# Patient Record
Sex: Female | Born: 1995 | Race: White | Hispanic: No | Marital: Single | Smoking: Never smoker
Health system: Southern US, Community
[De-identification: ages and names within clinical notes are randomized; demographics above are authoritative.]

## PROBLEM LIST (undated history)

## (undated) ENCOUNTER — Inpatient Hospital Stay (HOSPITAL_COMMUNITY): Payer: Self-pay

## (undated) ENCOUNTER — Emergency Department (HOSPITAL_COMMUNITY): Discharge status: Against Medical Advice | Payer: Medicaid Other

## (undated) DIAGNOSIS — A599 Trichomoniasis, unspecified: Secondary | ICD-10-CM

## (undated) DIAGNOSIS — B3731 Acute candidiasis of vulva and vagina: Secondary | ICD-10-CM

## (undated) DIAGNOSIS — N390 Urinary tract infection, site not specified: Secondary | ICD-10-CM

## (undated) DIAGNOSIS — T7840XA Allergy, unspecified, initial encounter: Secondary | ICD-10-CM

## (undated) DIAGNOSIS — A749 Chlamydial infection, unspecified: Secondary | ICD-10-CM

## (undated) DIAGNOSIS — B9689 Other specified bacterial agents as the cause of diseases classified elsewhere: Secondary | ICD-10-CM

## (undated) DIAGNOSIS — K219 Gastro-esophageal reflux disease without esophagitis: Secondary | ICD-10-CM

## (undated) DIAGNOSIS — N760 Acute vaginitis: Secondary | ICD-10-CM

## (undated) DIAGNOSIS — B373 Candidiasis of vulva and vagina: Secondary | ICD-10-CM

## (undated) HISTORY — DX: Gastro-esophageal reflux disease without esophagitis: K21.9

## (undated) HISTORY — PX: NO PAST SURGERIES: SHX2092

---
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
## 2016-05-23 ENCOUNTER — Encounter (HOSPITAL_COMMUNITY): Payer: Self-pay | Admitting: *Deleted

## 2016-05-23 ENCOUNTER — Emergency Department (HOSPITAL_COMMUNITY)
Admission: EM | Admit: 2016-05-23 | Discharge: 2016-05-23 | Disposition: A | Discharge status: Home / Self Care | Payer: No Typology Code available for payment source | Attending: Emergency Medicine | Admitting: Emergency Medicine

## 2016-05-23 DIAGNOSIS — Z79899 Other long term (current) drug therapy: Secondary | ICD-10-CM | POA: Diagnosis not present

## 2016-05-23 DIAGNOSIS — S060X0A Concussion without loss of consciousness, initial encounter: Secondary | ICD-10-CM | POA: Insufficient documentation

## 2016-05-23 DIAGNOSIS — Y939 Activity, unspecified: Secondary | ICD-10-CM | POA: Insufficient documentation

## 2016-05-23 DIAGNOSIS — Y999 Unspecified external cause status: Secondary | ICD-10-CM | POA: Diagnosis not present

## 2016-05-23 DIAGNOSIS — S0990XA Unspecified injury of head, initial encounter: Secondary | ICD-10-CM | POA: Diagnosis present

## 2016-05-23 DIAGNOSIS — Y92410 Unspecified street and highway as the place of occurrence of the external cause: Secondary | ICD-10-CM | POA: Insufficient documentation

## 2016-05-23 MED ORDER — METOCLOPRAMIDE HCL 10 MG PO TABS
10.0000 mg | ORAL_TABLET | Freq: Once | ORAL | Status: AC
  Administered 2016-05-23: 10 mg via ORAL
  Filled 2016-05-23: qty 1

## 2016-05-23 MED ORDER — IBUPROFEN 400 MG PO TABS
600.0000 mg | ORAL_TABLET | Freq: Once | ORAL | Status: AC
  Administered 2016-05-23: 400 mg via ORAL
  Filled 2016-05-23: qty 1

## 2016-05-23 MED ORDER — METOCLOPRAMIDE HCL 10 MG PO TABS
10.0000 mg | ORAL_TABLET | Freq: Three times a day (TID) | ORAL | 0 refills | Status: DC | PRN
Start: 2016-05-23 — End: 2016-12-24
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
## 2016-09-02 ENCOUNTER — Encounter: Payer: Self-pay | Admitting: Emergency Medicine

## 2016-09-02 ENCOUNTER — Emergency Department (HOSPITAL_COMMUNITY): Payer: Medicaid Other

## 2016-09-02 ENCOUNTER — Emergency Department (HOSPITAL_COMMUNITY)
Admission: EM | Admit: 2016-09-02 | Discharge: 2016-09-03 | Disposition: A | Discharge status: Home / Self Care | Payer: Medicaid Other | Attending: Emergency Medicine | Admitting: Emergency Medicine

## 2016-09-02 DIAGNOSIS — N898 Other specified noninflammatory disorders of vagina: Secondary | ICD-10-CM | POA: Diagnosis not present

## 2016-09-02 DIAGNOSIS — O26899 Other specified pregnancy related conditions, unspecified trimester: Secondary | ICD-10-CM

## 2016-09-02 DIAGNOSIS — R1030 Lower abdominal pain, unspecified: Secondary | ICD-10-CM | POA: Diagnosis present

## 2016-09-02 DIAGNOSIS — Z3A15 15 weeks gestation of pregnancy: Secondary | ICD-10-CM | POA: Diagnosis not present

## 2016-09-02 DIAGNOSIS — R109 Unspecified abdominal pain: Secondary | ICD-10-CM

## 2016-09-02 LAB — WET PREP, GENITAL
Sperm: NONE SEEN
Trich, Wet Prep: NONE SEEN
Yeast Wet Prep HPF POC: NONE SEEN

## 2016-09-02 LAB — I-STAT BETA HCG BLOOD, ED (MC, WL, AP ONLY)

## 2016-09-02 LAB — URINALYSIS, ROUTINE W REFLEX MICROSCOPIC
BILIRUBIN URINE: NEGATIVE
Glucose, UA: NEGATIVE mg/dL
Hgb urine dipstick: NEGATIVE
KETONES UR: 5 mg/dL — AB
Nitrite: NEGATIVE
PH: 6 (ref 5.0–8.0)
Protein, ur: 30 mg/dL — AB
Specific Gravity, Urine: 1.03 (ref 1.005–1.030)

## 2016-09-02 LAB — CBC
HCT: 34.7 % — ABNORMAL LOW (ref 36.0–46.0)
HEMOGLOBIN: 11.8 g/dL — AB (ref 12.0–15.0)
MCH: 29.4 pg (ref 26.0–34.0)
MCHC: 34 g/dL (ref 30.0–36.0)
MCV: 86.5 fL (ref 78.0–100.0)
Platelets: 259 10*3/uL (ref 150–400)
RBC: 4.01 MIL/uL (ref 3.87–5.11)
RDW: 12.5 % (ref 11.5–15.5)
WBC: 7.1 10*3/uL (ref 4.0–10.5)

## 2016-09-02 LAB — COMPREHENSIVE METABOLIC PANEL
ALT: 8 U/L — AB (ref 14–54)
ANION GAP: 6 (ref 5–15)
AST: 17 U/L (ref 15–41)
Albumin: 3.2 g/dL — ABNORMAL LOW (ref 3.5–5.0)
Alkaline Phosphatase: 54 U/L (ref 38–126)
BUN: 8 mg/dL (ref 6–20)
CHLORIDE: 106 mmol/L (ref 101–111)
CO2: 23 mmol/L (ref 22–32)
Calcium: 8.7 mg/dL — ABNORMAL LOW (ref 8.9–10.3)
Creatinine, Ser: 0.56 mg/dL (ref 0.44–1.00)
GFR calc non Af Amer: >60 mL/min (ref >60)
Glucose, Bld: 85 mg/dL (ref 65–99)
Potassium: 3.7 mmol/L (ref 3.5–5.1)
SODIUM: 135 mmol/L (ref 135–145)
Total Bilirubin: 0.3 mg/dL (ref 0.3–1.2)
Total Protein: 6.1 g/dL — ABNORMAL LOW (ref 6.5–8.1)

## 2016-09-02 LAB — LIPASE, BLOOD: Lipase: 26 U/L (ref 11–51)

## 2016-09-02 MED ORDER — PRENATAL COMPLETE 14-0.4 MG PO TABS: 1.0000 | ORAL_TABLET | Freq: Every day | ORAL | 0 refills | Status: DC

## 2016-09-02 NOTE — ED Notes (Signed)
Patient transported to Ultrasound 

## 2016-09-02 NOTE — Discharge Instructions (Signed)
make an appointment to establish obstetric care. Take prenatal vitamins as prescribed

## 2016-09-02 NOTE — ED Triage Notes (Signed)
Reports having lower abdominal cramping that goes into back.  On and off for the last two days.  States my last real period was in February.  Had several positive tests but unsure of how far along.  No bleeding or urinary symptoms.

## 2016-09-02 NOTE — ED Provider Notes (Signed)
[facility]-EMERGENCY DEPT Provider Note   CSN: [id number] Arrival date & time: 09/02/16  2035     History   Chief Complaint Chief Complaint  Patient presents with  . Abdominal Pain    HPI Gail Byrd is a 21 y.o. female.  This normally healthy 21 year old female who comes to the emergency room tonight with 2 days of low back pain, vaginal pain and lower abdominal pain.  She states she is having small amount of yellow/white vaginal discharge.  Denies any dysuria.  States her last normal menstrual period was February 2018.  She does not use any form of birth control.      Past Medical History:  Diagnosis Date  . Allergy   . Bacterial vaginosis   . Urinary tract infection   . Vaginal yeast infection     Patient Active Problem List   Diagnosis Date Noted  . Normal pregnancy, first 11/12/2014  . SVD (spontaneous vaginal delivery) 11/12/2014  . Abdominal pain affecting pregnancy, antepartum   . PCB (post coital bleeding)   . Second trimester bleeding   . [redacted] weeks gestation of pregnancy     Past Surgical History:  Procedure Laterality Date  . NO PAST SURGERIES      OB History    Gravida Para Term Preterm AB Living   1 1 1     1    SAB TAB Ectopic Multiple Live Births         0 1       Home Medications    Prior to Admission medications   Medication Sig Start Date End Date Taking? Authorizing Provider  Prenatal Vit-Fe Fumarate-FA (PRENATAL MULTIVITAMIN) TABS tablet Take 1 tablet by mouth daily at 12 noon.   Yes [provider]  albuterol (PROVENTIL HFA;VENTOLIN HFA) 108 (90 BASE) MCG/ACT inhaler Inhale 2 puffs into the lungs every 6 (six) hours as needed for wheezing or shortness of breath (emergency asthma medication).     [provider]  ibuprofen (ADVIL,MOTRIN) 600 MG tablet Take 1 tablet (600 mg total) by mouth every 6 (six) hours. Patient not taking: Reported on 09/02/2016 11/14/14   Meisinger, Tawanna Cooler, MD  metoCLOPramide (REGLAN) 10 MG tablet  Take 1 tablet (10 mg total) by mouth 3 (three) times daily as needed for nausea (headache). Patient not taking: Reported on 09/02/2016 05/23/16   Tomasita Crumble, MD  oxyCODONE-acetaminophen (PERCOCET/ROXICET) 5-325 MG per tablet Take 1-2 tablets by mouth every 4 (four) hours as needed for severe pain. Patient not taking: Reported on 09/02/2016 11/14/14   Meisinger, Tawanna Cooler, MD  Prenatal Vit-Fe Fumarate-FA (PRENATAL COMPLETE) 14-0.4 MG TABS Take 1 tablet by mouth daily. 09/02/16   Earley Favor, NP  Prenatal Vit-Fe Fumarate-FA (PRENATAL COMPLETE) 14-0.4 MG TABS Take 1 tablet by mouth daily. 09/02/16   Earley Favor, NP    Family History Family History  Problem Relation Age of Onset  . Diabetes Paternal Grandmother   . Depression Paternal Grandmother   . Drug abuse Paternal Grandmother   . Early death Paternal Grandmother   . Heart disease Paternal Grandmother   . Hypertension Paternal Grandmother   . Birth defects Mother   . Depression Mother   . Learning disabilities Mother   . Depression Father   . Learning disabilities Father   . Learning disabilities Sister   . Learning disabilities Brother   . Alcohol abuse Maternal Grandmother   . Depression Maternal Grandmother   . Mental illness Maternal Grandmother   . Arthritis Neg Hx   .  Asthma Neg Hx   . Cancer Neg Hx   . COPD Neg Hx   . Hearing loss Neg Hx   . Hyperlipidemia Neg Hx   . Kidney disease Neg Hx   . Mental retardation Neg Hx   . Miscarriages / Stillbirths Neg Hx   . Stroke Neg Hx   . Vision loss Neg Hx   . Varicose Veins Neg Hx     Social History Social History  Substance Use Topics  . Smoking status: Never Smoker  . Smokeless tobacco: Never Used  . Alcohol use No     Allergies   Patient has no known allergies.   Review of Systems Review of Systems  Constitutional: Negative for fever.  Respiratory: Negative for shortness of breath.   Cardiovascular: Negative for chest pain.  Gastrointestinal: Positive for abdominal  pain.  Genitourinary: Positive for vaginal discharge and vaginal pain. Negative for dysuria and vaginal bleeding.  Musculoskeletal: Positive for back pain.  All other systems reviewed and are negative.    Physical Exam Updated Vital Signs BP 103/72 (BP Location: Right Arm)   Pulse 81   Temp 98.6 F (37 C) (Oral)   Resp 18   Ht 5\' 3"  (1.6 m)   Wt 53.1 kg (117 lb 2 oz)   LMP 05/30/2016 (Approximate)   SpO2 100%   BMI 20.75 kg/m   Physical Exam  Constitutional: She appears well-developed and well-nourished.  HENT:  Head: Normocephalic.  Eyes: Pupils are equal, round, and reactive to light.  Neck: Normal range of motion.  Cardiovascular: Regular rhythm.  Tachycardia present.   Pulmonary/Chest: Effort normal.  Abdominal: Soft. She exhibits distension. There is no tenderness.  Genitourinary: Uterus is enlarged. Vaginal discharge found.  Musculoskeletal: Normal range of motion.  Neurological: She is alert.  Skin: Skin is warm.  Psychiatric: She has a normal mood and affect.  Nursing note and vitals reviewed.    ED Treatments / Results  Labs (all labs ordered are listed, but only abnormal results are displayed) Labs Reviewed  WET PREP, GENITAL - Abnormal; Notable for the following:       Result Value   Clue Cells Wet Prep HPF POC PRESENT (*)    WBC, Wet Prep HPF POC MANY (*)    All other components within normal limits  COMPREHENSIVE METABOLIC PANEL - Abnormal; Notable for the following:    Calcium 8.7 (*)    Total Protein 6.1 (*)    Albumin 3.2 (*)    ALT 8 (*)    All other components within normal limits  CBC - Abnormal; Notable for the following:    Hemoglobin 11.8 (*)    HCT 34.7 (*)    All other components within normal limits  URINALYSIS, ROUTINE W REFLEX MICROSCOPIC - Abnormal; Notable for the following:    Color, Urine AMBER (*)    APPearance CLOUDY (*)    Ketones, ur 5 (*)    Protein, ur 30 (*)    Leukocytes, UA LARGE (*)    Bacteria, UA RARE (*)     Squamous Epithelial / LPF 6-30 (*)    All other components within normal limits  I-STAT BETA HCG BLOOD, ED ([facility], [facility], [facility] ONLY) - Abnormal; Notable for the following:    I-stat hCG, quantitative >2,000.0 (*)    All other components within normal limits  LIPASE, BLOOD  GC/CHLAMYDIA PROBE AMP ([place]) NOT AT [facility]    EKG  EKG Interpretation None  Radiology [place] Ob Limited  Result Date: 09/02/2016 CLINICAL DATA:  Pregnant patient in second trimester pregnancy with lower abdominal pain for 2 days. EXAM: LIMITED OBSTETRIC ULTRASOUND FINDINGS: Number of Fetuses: 1 Heart Rate:  150 bpm Movement: Yes Presentation: Cephalic Placental Location: Anterior Previa: No Amniotic Fluid (Subjective):  Within normal limits. BPD:  3.01cm 15w  4d MATERNAL FINDINGS: Cervix:  Appears closed. Uterus/Adnexae: No abnormality visualized. IMPRESSION: Single live intrauterine pregnancy estimated gestational age [redacted] weeks 4 days. Estimated date of delivery 02/20/2017. This exam is performed on an emergent basis and does not comprehensively evaluate fetal size, dating, or anatomy; follow-up complete OB US should be considered if further fetal assessment is warranted. Electronically Signed   By: Rubye OaksMelanie  Ehinger M.D.   On: 09/02/2016 23:41    Procedures Procedures (including critical care time)  Medications Ordered in ED Medications - No data to display   Initial Impression / Assessment and Plan / ED Course  I have reviewed the triage vital signs and the nursing notes.  Pertinent labs & imaging results that were available during my care of the patient were reviewed by me and considered in my medical decision making (see chart for details).     Patient has single live 15 week 4 day fetus.  She was discharged home with prescription for prenatal vitamins and OB follow-up  Final Clinical Impressions(s) / ED Diagnoses   Final diagnoses:  [redacted] weeks gestation of pregnancy    New Prescriptions New  Prescriptions   PRENATAL VIT-FE FUMARATE-FA (PRENATAL COMPLETE) 14-0.4 MG TABS    Take 1 tablet by mouth daily.   PRENATAL VIT-FE FUMARATE-FA (PRENATAL COMPLETE) 14-0.4 MG TABS    Take 1 tablet by mouth daily.     Earley FavorSchulz, Tekeya Geffert, NP 09/02/16 2359    Gerhard MunchLockwood, Robert, MD 09/03/16 [phone]

## 2016-09-02 NOTE — ED Notes (Signed)
Pt returned from ultrasound

## 2016-09-03 LAB — GC/CHLAMYDIA PROBE AMP (~~LOC~~) NOT AT ARMC
Chlamydia: POSITIVE — AB
Neisseria Gonorrhea: NEGATIVE

## 2016-09-04 ENCOUNTER — Emergency Department (HOSPITAL_COMMUNITY)
Admission: EM | Admit: 2016-09-04 | Discharge: 2016-09-04 | Disposition: A | Discharge status: Home / Self Care | Payer: Medicaid Other | Attending: Emergency Medicine | Admitting: Emergency Medicine

## 2016-09-04 ENCOUNTER — Encounter (HOSPITAL_COMMUNITY): Payer: Self-pay

## 2016-09-04 DIAGNOSIS — Z79899 Other long term (current) drug therapy: Secondary | ICD-10-CM | POA: Diagnosis not present

## 2016-09-04 DIAGNOSIS — A64 Unspecified sexually transmitted disease: Secondary | ICD-10-CM | POA: Diagnosis not present

## 2016-09-04 DIAGNOSIS — Z3A15 15 weeks gestation of pregnancy: Secondary | ICD-10-CM | POA: Insufficient documentation

## 2016-09-04 DIAGNOSIS — O98312 Other infections with a predominantly sexual mode of transmission complicating pregnancy, second trimester: Secondary | ICD-10-CM | POA: Insufficient documentation

## 2016-09-04 MED ORDER — AZITHROMYCIN 250 MG PO TABS
1000.0000 mg | ORAL_TABLET | Freq: Once | ORAL | Status: AC
  Administered 2016-09-04: 1000 mg via ORAL
  Filled 2016-09-04: qty 4

## 2016-09-04 MED ORDER — AZITHROMYCIN 250 MG PO TABS: 1000.0000 mg | ORAL_TABLET | Freq: Once | ORAL | Status: DC

## 2016-09-04 MED ORDER — AZITHROMYCIN 1 G PO PACK
1.0000 g | PACK | Freq: Once | ORAL | Status: AC
  Administered 2016-09-04: 1 g via ORAL
  Filled 2016-09-04: qty 1

## 2016-09-04 NOTE — ED Triage Notes (Addendum)
Pt state she was contacted today, after previous visit and told she required further treatment due to positive  Std test. States [redacted] weeks pregnant.

## 2016-09-04 NOTE — Discharge Instructions (Addendum)
Please read attached information. If you experience any new or worsening signs or symptoms please return to the emergency room for evaluation. Please follow-up with your primary care provider or specialist as discussed. Please use medication prescribed only as directed and discontinue taking if you have any concerning signs or symptoms.   °

## 2016-09-04 NOTE — ED Provider Notes (Signed)
[facility]-EMER[facility]Y DEPT Provider Note   CSN: [id number] Arrival date & time: 09/04/16  1616   By signing my name below, I, Soijett Blue, attest that this documentation has been prepared under the direction and in the presence of Burna Forts, PA-C Electronically Signed: Soijett Blue, ED Scribe. 09/04/16. 6:55 PM.  History   Chief Complaint Chief Complaint  Patient presents with  . Abnormal Lab    HPI Gail Byrd is a 21 y.o. female who presents to the Emergency Department complaining of abnormal lab results onset 2 days ago. Pt reports associated vaginal discharge, lower abdominal pain, and nausea. Pt has not tried any medications for the relief of her symptoms. Pt states that she was evaluated in the ED on 09/02/2016 for abdominal pain and vaginal discharge. She notes that she found out that she was approximately [redacted] weeks pregnant and reports that this is her second pregnancy. She notes that she was contacted today and informed that she tested positive for a STD and would need treatment. Pt reports that she is [redacted] weeks pregnant. Denies having an OB-GYN specialist at this time. She denies vaginal bleeding, vomiting, and any other symptoms.     The history is provided by the patient. No language interpreter was used.    Past Medical History:  Diagnosis Date  . Allergy   . Bacterial vaginosis   . Urinary tract infection   . Vaginal yeast infection     Patient Active Problem List   Diagnosis Date Noted  . Normal pregnancy, first 11/12/2014  . SVD (spontaneous vaginal delivery) 11/12/2014  . Abdominal pain affecting pregnancy, antepartum   . PCB (post coital bleeding)   . Second trimester bleeding   . [redacted] weeks gestation of pregnancy     Past Surgical History:  Procedure Laterality Date  . NO PAST SURGERIES      OB History    Gravida Para Term Preterm AB Living   3 1 1     1    SAB TAB Ectopic Multiple Live Births         0 1       Home Medications    Prior to  Admission medications   Medication Sig Start Date End Date Taking? Authorizing Provider  albuterol (PROVENTIL HFA;VENTOLIN HFA) 108 (90 BASE) MCG/ACT inhaler Inhale 2 puffs into the lungs every 6 (six) hours as needed for wheezing or shortness of breath (emergency asthma medication).     [provider]  ibuprofen (ADVIL,MOTRIN) 600 MG tablet Take 1 tablet (600 mg total) by mouth every 6 (six) hours. Patient not taking: Reported on 09/02/2016 11/14/14   Meisinger, Tawanna Cooler, MD  metoCLOPramide (REGLAN) 10 MG tablet Take 1 tablet (10 mg total) by mouth 3 (three) times daily as needed for nausea (headache). Patient not taking: Reported on 09/02/2016 05/23/16   Tomasita Crumble, MD  oxyCODONE-acetaminophen (PERCOCET/ROXICET) 5-325 MG per tablet Take 1-2 tablets by mouth every 4 (four) hours as needed for severe pain. Patient not taking: Reported on 09/02/2016 11/14/14   Meisinger, Tawanna Cooler, MD  Prenatal Vit-Fe Fumarate-FA (PRENATAL COMPLETE) 14-0.4 MG TABS Take 1 tablet by mouth daily. 09/02/16   Earley Favor, NP  Prenatal Vit-Fe Fumarate-FA (PRENATAL COMPLETE) 14-0.4 MG TABS Take 1 tablet by mouth daily. 09/02/16   Earley Favor, NP  Prenatal Vit-Fe Fumarate-FA (PRENATAL MULTIVITAMIN) TABS tablet Take 1 tablet by mouth daily at 12 noon.    [provider]    Family History Family History  Problem Relation Age of Onset  .  Diabetes Paternal Grandmother   . Depression Paternal Grandmother   . Drug abuse Paternal Grandmother   . Early death Paternal Grandmother   . Heart disease Paternal Grandmother   . Hypertension Paternal Grandmother   . Birth defects Mother   . Depression Mother   . Learning disabilities Mother   . Depression Father   . Learning disabilities Father   . Learning disabilities Sister   . Learning disabilities Brother   . Alcohol abuse Maternal Grandmother   . Depression Maternal Grandmother   . Mental illness Maternal Grandmother   . Arthritis Neg Hx   . Asthma Neg Hx   . Cancer  Neg Hx   . COPD Neg Hx   . Hearing loss Neg Hx   . Hyperlipidemia Neg Hx   . Kidney disease Neg Hx   . Mental retardation Neg Hx   . Miscarriages / Stillbirths Neg Hx   . Stroke Neg Hx   . Vision loss Neg Hx   . Varicose Veins Neg Hx     Social History Social History  Substance Use Topics  . Smoking status: Never Smoker  . Smokeless tobacco: Never Used  . Alcohol use No     Allergies   Patient has no known allergies.   Review of Systems Review of Systems  Gastrointestinal: Positive for abdominal pain and nausea. Negative for vomiting.  Genitourinary: Positive for vaginal discharge. Negative for vaginal bleeding.  All other systems reviewed and are negative.    Physical Exam Updated Vital Signs BP 109/71 (BP Location: Right Arm)   Pulse 95   Temp 98.9 F (37.2 C) (Oral)   Resp 16   Ht 5\' 3"  (1.6 m)   Wt 117 lb (53.1 kg)   LMP 05/30/2016 (Approximate)   SpO2 99%   BMI 20.73 kg/m   Physical Exam  Constitutional: She is oriented to person, place, and time. She appears well-developed and well-nourished. No distress.  HENT:  Head: Normocephalic and atraumatic.  Eyes: EOM are normal.  Neck: Neck supple.  Cardiovascular: Normal rate.   Pulmonary/Chest: Effort normal. No respiratory distress.  Abdominal: Normal appearance. She exhibits no distension. There is no tenderness.  Gravid abdomen.   Genitourinary: Cervix exhibits no motion tenderness. Right adnexum displays no tenderness. Left adnexum displays no tenderness. Vaginal discharge found.  Genitourinary Comments: Nurse chaperone present for exam. Purulent vaginal discharge. No CMT. No adnexal tenderness.  Musculoskeletal: Normal range of motion.  Neurological: She is alert and oriented to person, place, and time.  Skin: Skin is warm and dry.  Psychiatric: She has a normal mood and affect. Her behavior is normal.  Nursing note and vitals reviewed.    ED Treatments / Results  DIAGNOSTIC STUDIES: Oxygen  Saturation is 99% on RA, nl by my interpretation.    COORDINATION OF CARE: 6:49 PM Discussed treatment plan with pt at bedside which includes pelvic exam and pt agreed to plan.   Labs (all labs ordered are listed, but only abnormal results are displayed) Labs Reviewed - No data to display  EKG  EKG Interpretation None       Radiology US Ob Limited  Result Date: 09/02/2016 CLINICAL DATA:  Pregnant patient in second trimester pregnancy with lower abdominal pain for 2 days. EXAM: LIMITED OBSTETRIC ULTRASOUND FINDINGS: Number of Fetuses: 1 Heart Rate:  150 bpm Movement: Yes Presentation: Cephalic Placental Location: Anterior Previa: No Amniotic Fluid (Subjective):  Within normal limits. BPD:  3.01cm 15w  4d MATERNAL FINDINGS: Cervix:  Appears  closed. Uterus/Adnexae: No abnormality visualized. IMPRESSION: Single live intrauterine pregnancy estimated gestational age [redacted] weeks 4 days. Estimated date of delivery 02/20/2017. This exam is performed on an emergent basis and does not comprehensively evaluate fetal size, dating, or anatomy; follow-up complete OB US should be considered if further fetal assessment is warranted. Electronically Signed   By: Rubye OaksMelanie  Ehinger M.D.   On: 09/02/2016 23:41    Procedures Procedures (including critical care time)  Medications Ordered in ED Medications  azithromycin (ZITHROMAX) powder 1 g (not administered)  azithromycin (ZITHROMAX) tablet 1,000 mg (1,000 mg Oral Given 09/04/16 2025)     Initial Impression / Assessment and Plan / ED Course  I have reviewed the triage vital signs and the nursing notes.  Pertinent labs & imaging results that were available during my care of the patient were reviewed by me and considered in my medical decision making (see chart for details).     Labs:   Imaging:  Consults:  Therapeutics:Azithromycin  Discharge Meds:   Assessment/Plan: 21 year old female presents today with follow-up from laboratory analysis.  Patient is [redacted] weeks pregnant, she has a positive chlamydia, negative gonorrhea test. Pelvic exam shows no significant cervical motion tenderness, no signs of systemic illness. I have very low suspicion for PID in this patient. She'll be treated with one dose of azithromycin. She is encouraged follow-up with her OB/GYN. Return precautions given. She verbalized understanding and agreement to today's plan.  Patient vomited up medicine 15 minutes after taking it. Although I did not see any digested pills in her vomit which she had any This is concerning that she did not receive the appropriate dose. I spoke with pharmacy they agreed that re-dosing at 1 g would be the most appropriate treatment plan in this patient as likely she did not receive the medicine. Patient will be even one more gram in a power phone and discharged home.  Final Clinical Impressions(s) / ED Diagnoses   Final diagnoses:  STD (female)    New Prescriptions Discharge Medication List as of 09/04/2016  8:12 PM     I personally performed the services described in this documentation, which was scribed in my presence. The recorded information has been reviewed and is accurate.    Eyvonne MechanicHedges, Jahnasia Tatum, PA-C 09/04/16 2050    Eyvonne MechanicHedges, Parris Signer, PA-C 09/04/16 2125    Benjiman CorePickering, Nathan, MD 09/04/16 2240

## 2016-10-04 ENCOUNTER — Encounter (HOSPITAL_COMMUNITY): Payer: Self-pay | Admitting: *Deleted

## 2016-10-04 ENCOUNTER — Inpatient Hospital Stay (HOSPITAL_COMMUNITY)
Admission: AD | Admit: 2016-10-04 | Discharge: 2016-10-04 | Disposition: A | Discharge status: Home / Self Care | Payer: Medicaid Other | Source: Ambulatory Visit | Attending: Obstetrics & Gynecology | Admitting: Obstetrics & Gynecology

## 2016-10-04 DIAGNOSIS — R1032 Left lower quadrant pain: Secondary | ICD-10-CM | POA: Diagnosis present

## 2016-10-04 DIAGNOSIS — O9989 Other specified diseases and conditions complicating pregnancy, childbirth and the puerperium: Secondary | ICD-10-CM

## 2016-10-04 DIAGNOSIS — Z3A19 19 weeks gestation of pregnancy: Secondary | ICD-10-CM | POA: Diagnosis not present

## 2016-10-04 DIAGNOSIS — N898 Other specified noninflammatory disorders of vagina: Secondary | ICD-10-CM | POA: Diagnosis not present

## 2016-10-04 DIAGNOSIS — R1030 Lower abdominal pain, unspecified: Secondary | ICD-10-CM

## 2016-10-04 DIAGNOSIS — O26892 Other specified pregnancy related conditions, second trimester: Secondary | ICD-10-CM | POA: Insufficient documentation

## 2016-10-04 DIAGNOSIS — N949 Unspecified condition associated with female genital organs and menstrual cycle: Secondary | ICD-10-CM

## 2016-10-04 LAB — URINALYSIS, ROUTINE W REFLEX MICROSCOPIC
Bilirubin Urine: NEGATIVE
GLUCOSE, UA: NEGATIVE mg/dL
HGB URINE DIPSTICK: NEGATIVE
Ketones, ur: NEGATIVE mg/dL
NITRITE: NEGATIVE
PH: 6 (ref 5.0–8.0)
Protein, ur: 30 mg/dL — AB
Specific Gravity, Urine: 1.028 (ref 1.005–1.030)

## 2016-10-04 LAB — WET PREP, GENITAL
CLUE CELLS WET PREP: NONE SEEN
Sperm: NONE SEEN
Trich, Wet Prep: NONE SEEN
YEAST WET PREP: NONE SEEN

## 2016-10-04 LAB — GC/CHLAMYDIA PROBE AMP (~~LOC~~) NOT AT ARMC
Chlamydia: NEGATIVE
Neisseria Gonorrhea: NEGATIVE

## 2016-10-04 NOTE — MAU Provider Note (Signed)
History     CSN: [id number]  Arrival date and time: 10/04/16 0208   First Provider Initiated Contact with Patient 10/04/16 0253      Chief Complaint  Patient presents with  . Flank Pain  . Other    leg ache   HPI Gail Byrd is a 21 y.o. G2P1001 at 206w2d who presents to MAU today with complaint of LLQ abdominal pain. She states that it started at 2200 last night. She states it is rated at a 5/10 now, but was worse earlier. She states pain is mostly with walking and standing. She denies vaginal bleeding, fever, N/V/D or constipation. She has had a yellow discharge. She was treated for Chlamydia last month.    OB History    Gravida Para Term Preterm AB Living   2 1 1     1    SAB TAB Ectopic Multiple Live Births         0 1      Past Medical History:  Diagnosis Date  . Allergy   . Bacterial vaginosis   . Urinary tract infection   . Vaginal yeast infection     Past Surgical History:  Procedure Laterality Date  . NO PAST SURGERIES      Family History  Problem Relation Age of Onset  . Diabetes Paternal Grandmother   . Depression Paternal Grandmother   . Drug abuse Paternal Grandmother   . Early death Paternal Grandmother   . Heart disease Paternal Grandmother   . Hypertension Paternal Grandmother   . Birth defects Mother   . Depression Mother   . Learning disabilities Mother   . Depression Father   . Learning disabilities Father   . Learning disabilities Sister   . Learning disabilities Brother   . Alcohol abuse Maternal Grandmother   . Depression Maternal Grandmother   . Mental illness Maternal Grandmother   . Arthritis Neg Hx   . Asthma Neg Hx   . Cancer Neg Hx   . COPD Neg Hx   . Hearing loss Neg Hx   . Hyperlipidemia Neg Hx   . Kidney disease Neg Hx   . Mental retardation Neg Hx   . Miscarriages / Stillbirths Neg Hx   . Stroke Neg Hx   . Vision loss Neg Hx   . Varicose Veins Neg Hx     Social History  Substance Use Topics  . Smoking  status: Never Smoker  . Smokeless tobacco: Never Used  . Alcohol use No    Allergies: No Known Allergies  Prescriptions Prior to Admission  Medication Sig Dispense Refill Last Dose  . albuterol (PROVENTIL HFA;VENTOLIN HFA) 108 (90 BASE) MCG/ACT inhaler Inhale 2 puffs into the lungs every 6 (six) hours as needed for wheezing or shortness of breath (emergency asthma medication).    rescue  . ibuprofen (ADVIL,MOTRIN) 600 MG tablet Take 1 tablet (600 mg total) by mouth every 6 (six) hours. (Patient not taking: Reported on 09/02/2016) 30 tablet 0 Completed Course at Unknown time  . metoCLOPramide (REGLAN) 10 MG tablet Take 1 tablet (10 mg total) by mouth 3 (three) times daily as needed for nausea (headache). (Patient not taking: Reported on 09/02/2016) 10 tablet 0 Completed Course at Unknown time  . oxyCODONE-acetaminophen (PERCOCET/ROXICET) 5-325 MG per tablet Take 1-2 tablets by mouth every 4 (four) hours as needed for severe pain. (Patient not taking: Reported on 09/02/2016) 20 tablet 0 Completed Course at Unknown time  . Prenatal Vit-Fe Fumarate-FA (PRENATAL COMPLETE)  14-0.4 MG TABS Take 1 tablet by mouth daily. 60 each 0   . Prenatal Vit-Fe Fumarate-FA (PRENATAL COMPLETE) 14-0.4 MG TABS Take 1 tablet by mouth daily. 60 each 0   . Prenatal Vit-Fe Fumarate-FA (PRENATAL MULTIVITAMIN) TABS tablet Take 1 tablet by mouth daily at 12 noon.   09/01/2016 at Unknown time    Review of Systems  Constitutional: Negative for fever.  Gastrointestinal: Positive for abdominal pain. Negative for constipation, diarrhea, nausea and vomiting.  Genitourinary: Positive for vaginal discharge. Negative for dysuria, frequency, urgency and vaginal bleeding.   Physical Exam   Blood pressure 115/66, pulse 87, temperature 99.5 F (37.5 C), resp. rate 16, last menstrual period 05/30/2016, unknown if currently breastfeeding.  Physical Exam  Nursing note and vitals reviewed. Constitutional: She is oriented to person, place,  and time. She appears well-developed and well-nourished. No distress.  HENT:  Head: Normocephalic and atraumatic.  Cardiovascular: Normal rate.   Respiratory: Effort normal.  GI: Soft. She exhibits no distension and no mass. There is no tenderness. There is no rebound and no guarding.  Genitourinary: Uterus is enlarged. Uterus is not tender. Cervix exhibits friability. Cervix exhibits no motion tenderness and no discharge. No bleeding in the vagina. Vaginal discharge (small yellow, mucous) found.  Neurological: She is alert and oriented to person, place, and time.  Skin: Skin is warm and dry. No erythema.  Psychiatric: She has a normal mood and affect.  Dilation: Closed Effacement (%): Thick Cervical Position: Posterior Exam by:: Vonzella Nipple, PA-C   Results for orders placed or performed during the hospital encounter of 10/04/16 (from the past 24 hour(s))  Urinalysis, Routine w reflex microscopic     Status: Abnormal   Collection Time: 10/04/16  2:35 AM  Result Value Ref Range   Color, Urine YELLOW YELLOW   APPearance HAZY (A) CLEAR   Specific Gravity, Urine 1.028 1.005 - 1.030   pH 6.0 5.0 - 8.0   Glucose, UA NEGATIVE NEGATIVE mg/dL   Hgb urine dipstick NEGATIVE NEGATIVE   Bilirubin Urine NEGATIVE NEGATIVE   Ketones, ur NEGATIVE NEGATIVE mg/dL   Protein, ur 30 (A) NEGATIVE mg/dL   Nitrite NEGATIVE NEGATIVE   Leukocytes, UA SMALL (A) NEGATIVE   RBC / HPF 0-5 0 - 5 RBC/hpf   WBC, UA 6-30 0 - 5 WBC/hpf   Bacteria, UA RARE (A) NONE SEEN   Squamous Epithelial / LPF 6-30 (A) NONE SEEN   Mucous PRESENT   Wet prep, genital     Status: Abnormal   Collection Time: 10/04/16  3:10 AM  Result Value Ref Range   Yeast Wet Prep HPF POC NONE SEEN NONE SEEN   Trich, Wet Prep NONE SEEN NONE SEEN   Clue Cells Wet Prep HPF POC NONE SEEN NONE SEEN   WBC, Wet Prep HPF POC TOO NUMEROUS TO COUNT (A) NONE SEEN   Sperm NONE SEEN     MAU Course  Procedures None  MDM FHR - 140 bpm with  doppler UA, wet prep and GC/Chlmaydia today  Patient offered presumptive treatment for Chlamydia given recent infection and TNTC WBCs on wet prep. Patient declines stating that she has not had sexual contact since treatment last month.  Assessment and Plan  A: SIUP at [redacted]w[redacted]d Round ligament pain Vaginal discharge in pregnancy, second trimester   P: Discharge home Tylenol PRN for pain Discussed use of abdominal binder and warm bath/shower for pain  Second trimester precautions discussed Patient advised to follow-up with [facility] as  scheduled to start prenatal care Patient may return to MAU as needed or if her condition were to change or worsen   Vonzella Nipple, PA-C 10/04/2016, 3:37 AM

## 2016-10-04 NOTE — Discharge Instructions (Signed)

## 2016-10-04 NOTE — MAU Note (Signed)
Pt states she started having some sharp pain on the left side of her side.  It comes and goes which started around 2200 and around 0000 her legs started aching.  Denies LOF/VB.

## 2016-10-05 LAB — CULTURE, OB URINE: Culture: NO GROWTH

## 2016-10-09 LAB — OB RESULTS CONSOLE GC/CHLAMYDIA: GC PROBE AMP, GENITAL: NEGATIVE

## 2016-10-09 LAB — OB RESULTS CONSOLE HIV ANTIBODY (ROUTINE TESTING): HIV: NONREACTIVE

## 2016-10-09 LAB — OB RESULTS CONSOLE RUBELLA ANTIBODY, IGM: Rubella: IMMUNE

## 2016-10-09 LAB — OB RESULTS CONSOLE HEPATITIS B SURFACE ANTIGEN: HEP B S AG: NEGATIVE

## 2016-10-12 ENCOUNTER — Inpatient Hospital Stay (HOSPITAL_COMMUNITY)
Admission: AD | Admit: 2016-10-12 | Discharge: 2016-10-12 | Disposition: A | Discharge status: Home / Self Care | Payer: Medicaid Other | Source: Ambulatory Visit | Attending: Obstetrics and Gynecology | Admitting: Obstetrics and Gynecology

## 2016-10-12 ENCOUNTER — Encounter (HOSPITAL_COMMUNITY): Payer: Self-pay | Admitting: *Deleted

## 2016-10-12 DIAGNOSIS — O368120 Decreased fetal movements, second trimester, not applicable or unspecified: Secondary | ICD-10-CM | POA: Diagnosis not present

## 2016-10-12 DIAGNOSIS — Z3A20 20 weeks gestation of pregnancy: Secondary | ICD-10-CM | POA: Diagnosis not present

## 2016-10-12 DIAGNOSIS — Z3492 Encounter for supervision of normal pregnancy, unspecified, second trimester: Secondary | ICD-10-CM

## 2016-10-12 LAB — URINALYSIS, ROUTINE W REFLEX MICROSCOPIC
Bilirubin Urine: NEGATIVE
GLUCOSE, UA: NEGATIVE mg/dL
Hgb urine dipstick: NEGATIVE
Ketones, ur: NEGATIVE mg/dL
Nitrite: NEGATIVE
PH: 6 (ref 5.0–8.0)
Protein, ur: NEGATIVE mg/dL
SPECIFIC GRAVITY, URINE: 1.011 (ref 1.005–1.030)

## 2016-10-12 NOTE — Discharge Instructions (Signed)
Constipation, Adult Constipation is when a person has fewer bowel movements in a week than normal, has difficulty having a bowel movement, or has stools that are dry, hard, or larger than normal. Constipation may be caused by an underlying condition. It may become worse with age if a person takes certain medicines and does not take in enough fluids. Follow these instructions at home: Eating and drinking   Eat foods that have a lot of fiber, such as fresh fruits and vegetables, whole grains, and beans.  Limit foods that are high in fat, low in fiber, or overly processed, such as french fries, hamburgers, cookies, candies, and soda.  Drink enough fluid to keep your urine clear or pale yellow. General instructions  Exercise regularly or as told by your health care provider.  Go to the restroom when you have the urge to go. Do not hold it in.  Take over-the-counter and prescription medicines only as told by your health care provider. These include any fiber supplements.  Practice pelvic floor retraining exercises, such as deep breathing while relaxing the lower abdomen and pelvic floor relaxation during bowel movements.  Watch your condition for any changes.  Keep all follow-up visits as told by your health care provider. This is important. Contact a health care provider if:  You have pain that gets worse.  You have a fever.  You do not have a bowel movement after 4 days.  You vomit.  You are not hungry.  You lose weight.  You are bleeding from the anus.  You have thin, pencil-like stools. Get help right away if:  You have a fever and your symptoms suddenly get worse.  You leak stool or have blood in your stool.  Your abdomen is bloated.  You have severe pain in your abdomen.  You feel dizzy or you faint. This information is not intended to replace advice given to you by your health care provider. Make sure you discuss any questions you have with your health care  provider. Document Released: 12/16/2003 Document Revised: 10/07/2015 Document Reviewed: 09/07/2015 Elsevier Interactive Patient Education  2017 [employer] Preventing Injuries During Pregnancy Trauma is the most common cause of injury and death in pregnant women. This can also result in serious harm to the baby or even death. How can injuries affect my pregnancy? Your baby is protected in the womb (uterus) by a sac filled with fluid (amniotic sac). Your baby can be harmed if there is a direct blow to your abdomen and pelvis. Trauma may be caused by:  Falls. These are more common in the second and third trimester of pregnancy.  Automobile accidents.  Domestic violence or assault.  Severe burns, such as from fire or electricity.  These injuries can result in:  Tearing of your uterus.  The placenta pulling away from the wall of the uterus (placental abruption).  The amniotic sac breaking open (rupture of membranes).  Blockage or decrease in the blood supply to your baby.  Going into labor earlier than expected.  Severe injuries to other parts of your body, such as your brain, spine, heart, lungs, or other organs.  Minor falls and low-impact automobile accidents do not usually harm your baby, even if they cause a little harm to you. What can I do to lower my risk? Safety  Remove slippery rugs and loose objects on the floor. They increase your risk of tripping or slipping.  Wear comfortable shoes that have a good grip on the sole. Do not  wear high-heeled shoes.  Always wear your seat belt properly when riding in a car. Use both the lap and shoulder belt, with the lap belt below your abdomen. Always practice safe driving. Do not ride on a motorcycle while pregnant. Activity  Avoid walking on wet or slippery floors.  Do not participate in rough and violent activities or sports.  Avoid high-risk situations and activities such as: ? Lifting heavy pots of boiling or hot  liquids. ? Fixing electrical problems. ? Being near fires or starting fires. General instructions  Take over-the-counter and prescription medicines only as told by your health care provider.  Know your blood type and the father's blood type in case you develop vaginal bleeding or experience an injury for which a blood transfusion is needed.  Spousal abuse can be a serious cause of trauma during pregnancy. If you are a victim of domestic violence or assault: ? Call your local emergency services (911 in the [place] ? Contact the [employer] Violence Hotline for help and support. When should I seek immediate medical care? Get help right away if:  You fall on your abdomen or experience any serious blow to your abdomen.  You develop stiffness in your neck or pain after a fall or from other trauma.  You develop a headache or vision problems after a fall or from other trauma.  You do not feel the baby moving after a fall or trauma, or you feel that the baby is not moving as much as before the fall or trauma.  You have been the victim of domestic violence or any other kind of physical attack.  You have been in a car accident.  You develop vaginal bleeding.  You have fluid leaking from the vagina.  You develop uterine contractions. Symptoms include pelvic cramping, pain, or serious low back pain.  You become weak, faint, or have uncontrolled vomiting after trauma.  You have a serious burn. This includes burns to the face, neck, hands, or genitals, or burns greater than the size of your palm anywhere else.  Summary  Trauma is the most common cause of injury and death in pregnant women and can also lead to injury or death of the baby.  Falls, automobile accidents, domestic violence or assault, and severe burns can injure you or your baby. Make sure to get medical help right away if you experience any of these during your pregnancy.  Take steps to prevent slips or falls in your  home, such as avoiding slippery floors and removing loose rugs.  Always wear your seat belt properly when riding in a car. Practice safe driving. This information is not intended to replace advice given to you by your health care provider. Make sure you discuss any questions you have with your health care provider. Document Released: 04/26/2004 Document Revised: 03/28/2016 Document Reviewed: 03/28/2016 Elsevier Interactive Patient Education  2017 [employer].

## 2016-10-12 NOTE — MAU Provider Note (Signed)
History     CSN: [id number]  Arrival date and time: 10/12/16 2028  First Provider Initiated Contact with Patient 10/12/16 2127      Chief Complaint  Patient presents with  . Decreased Fetal Movement   HPI Gail Byrd is a 21 y.o. G2P1001 at [redacted]w[redacted]d who presents with decreased fetal movement. States yesterday (Thursday) at noon she hit her abdomen with the handle bar of her daughter's stroller. Since then reported decrease in fetal movement. States she normally feels baby move several times per day. States movement has improved since this afternoon. Denies abdominal pain, vaginal bleeding, or LOF.   OB History    Gravida Para Term Preterm AB Living   2 1 1     1    SAB TAB Ectopic Multiple Live Births         0 1      Past Medical History:  Diagnosis Date  . Allergy   . Bacterial vaginosis   . Urinary tract infection   . Vaginal yeast infection     Past Surgical History:  Procedure Laterality Date  . NO PAST SURGERIES      Family History  Problem Relation Age of Onset  . Diabetes Paternal Grandmother   . Depression Paternal Grandmother   . Drug abuse Paternal Grandmother   . Early death Paternal Grandmother   . Heart disease Paternal Grandmother   . Hypertension Paternal Grandmother   . Birth defects Mother   . Depression Mother   . Learning disabilities Mother   . Depression Father   . Learning disabilities Father   . Learning disabilities Sister   . Learning disabilities Brother   . Alcohol abuse Maternal Grandmother   . Depression Maternal Grandmother   . Mental illness Maternal Grandmother   . Arthritis Neg Hx   . Asthma Neg Hx   . Cancer Neg Hx   . COPD Neg Hx   . Hearing loss Neg Hx   . Hyperlipidemia Neg Hx   . Kidney disease Neg Hx   . Mental retardation Neg Hx   . Miscarriages / Stillbirths Neg Hx   . Stroke Neg Hx   . Vision loss Neg Hx   . Varicose Veins Neg Hx     Social History  Substance Use Topics  . Smoking status: Never Smoker   . Smokeless tobacco: Never Used  . Alcohol use No    Allergies: No Known Allergies  Prescriptions Prior to Admission  Medication Sig Dispense Refill Last Dose  . Prenatal Vit-Fe Fumarate-FA (PRENATAL COMPLETE) 14-0.4 MG TABS Take 1 tablet by mouth daily. 60 each 0 10/12/2016 at Unknown time  . albuterol (PROVENTIL HFA;VENTOLIN HFA) 108 (90 BASE) MCG/ACT inhaler Inhale 2 puffs into the lungs every 6 (six) hours as needed for wheezing or shortness of breath (emergency asthma medication).    rescue  . metoCLOPramide (REGLAN) 10 MG tablet Take 1 tablet (10 mg total) by mouth 3 (three) times daily as needed for nausea (headache). (Patient not taking: Reported on 09/02/2016) 10 tablet 0 Completed Course at Unknown time  . Prenatal Vit-Fe Fumarate-FA (PRENATAL COMPLETE) 14-0.4 MG TABS Take 1 tablet by mouth daily. 60 each 0   . Prenatal Vit-Fe Fumarate-FA (PRENATAL MULTIVITAMIN) TABS tablet Take 1 tablet by mouth daily at 12 noon.   09/01/2016 at Unknown time    Review of Systems  Constitutional: Negative.   Gastrointestinal: Negative.   Genitourinary: Negative.    Physical Exam   Blood pressure 111/63, pulse  79, temperature 98.2 F (36.8 C), resp. rate 18, height 5\' 3"  (1.6 m), weight 120 lb (54.4 kg), last menstrual period 05/30/2016, unknown if currently breastfeeding.  Physical Exam  Nursing note and vitals reviewed. Constitutional: She is oriented to person, place, and time. She appears well-developed and well-nourished. No distress.  HENT:  Head: Normocephalic and atraumatic.  Eyes: Conjunctivae are normal. Right eye exhibits no discharge. Left eye exhibits no discharge. No scleral icterus.  Neck: Normal range of motion.  Respiratory: Effort normal. No respiratory distress. She has no wheezes.  GI: Soft. There is no tenderness.  Abdomen soft and non tender. No bruising noted.   Neurological: She is alert and oriented to person, place, and time.  Skin: Skin is warm and dry. She is not  diaphoretic.  Psychiatric: She has a normal mood and affect. Her behavior is normal. Judgment and thought content normal.    MAU Course  Procedures Results for orders placed or performed during the hospital encounter of 10/12/16 (from the past 24 hour(s))  Urinalysis, Routine w reflex microscopic     Status: Abnormal   Collection Time: 10/12/16  8:49 PM  Result Value Ref Range   Color, Urine YELLOW YELLOW   APPearance CLOUDY (A) CLEAR   Specific Gravity, Urine 1.011 1.005 - 1.030   pH 6.0 5.0 - 8.0   Glucose, UA NEGATIVE NEGATIVE mg/dL   Hgb urine dipstick NEGATIVE NEGATIVE   Bilirubin Urine NEGATIVE NEGATIVE   Ketones, ur NEGATIVE NEGATIVE mg/dL   Protein, ur NEGATIVE NEGATIVE mg/dL   Nitrite NEGATIVE NEGATIVE   Leukocytes, UA LARGE (A) NEGATIVE   RBC / HPF 0-5 0 - 5 RBC/hpf   WBC, UA TOO NUMEROUS TO COUNT 0 - 5 WBC/hpf   Bacteria, UA RARE (A) NONE SEEN   Squamous Epithelial / LPF 6-30 (A) NONE SEEN   Mucous PRESENT     MDM FHT 141 Abdomen soft & non tender Pt reassured by FHTs S/w Dr. Dareen PianoAnderson. Ok to discharge home  Assessment and Plan  A; 1. Decreased fetal movements in second trimester, single or unspecified fetus   2. Fetal heart tones present, second trimester    P; Discharge home Discussed reasons to return to MAU including abdominal pain, vaginal bleeding, LOF, or concerns with fetal movement Keep f/u with OB  Judeth HornErin Pinkney Venard 10/12/2016, 9:27 PM

## 2016-10-12 NOTE — MAU Note (Signed)
On THurs about 12n was pushing the stroller and hit a bump and handle hit my belly. Baby has not been as active today. Denies LOF or bleeding. Some mild cramping which I had before the stroller hitting abd. THink cramping is due to constipation. Last BM yesterday but small.

## 2016-10-17 ENCOUNTER — Inpatient Hospital Stay (HOSPITAL_COMMUNITY)
Admission: AD | Admit: 2016-10-17 | Discharge: 2016-10-17 | Disposition: A | Discharge status: Home / Self Care | Payer: Medicaid Other | Source: Ambulatory Visit | Attending: Family Medicine | Admitting: Family Medicine

## 2016-10-17 ENCOUNTER — Encounter (HOSPITAL_COMMUNITY): Payer: Self-pay | Admitting: *Deleted

## 2016-10-17 DIAGNOSIS — R109 Unspecified abdominal pain: Secondary | ICD-10-CM

## 2016-10-17 DIAGNOSIS — N949 Unspecified condition associated with female genital organs and menstrual cycle: Secondary | ICD-10-CM

## 2016-10-17 DIAGNOSIS — R102 Pelvic and perineal pain: Secondary | ICD-10-CM | POA: Diagnosis present

## 2016-10-17 DIAGNOSIS — Z3A21 21 weeks gestation of pregnancy: Secondary | ICD-10-CM | POA: Insufficient documentation

## 2016-10-17 DIAGNOSIS — O26892 Other specified pregnancy related conditions, second trimester: Secondary | ICD-10-CM | POA: Insufficient documentation

## 2016-10-17 DIAGNOSIS — O9989 Other specified diseases and conditions complicating pregnancy, childbirth and the puerperium: Secondary | ICD-10-CM | POA: Diagnosis not present

## 2016-10-17 LAB — URINALYSIS, ROUTINE W REFLEX MICROSCOPIC
BILIRUBIN URINE: NEGATIVE
GLUCOSE, UA: NEGATIVE mg/dL
HGB URINE DIPSTICK: NEGATIVE
Ketones, ur: NEGATIVE mg/dL
NITRITE: NEGATIVE
PH: 6 (ref 5.0–8.0)
Protein, ur: 30 mg/dL — AB
SPECIFIC GRAVITY, URINE: 1.025 (ref 1.005–1.030)

## 2016-10-17 LAB — WET PREP, GENITAL
Clue Cells Wet Prep HPF POC: NONE SEEN
SPERM: NONE SEEN
Trich, Wet Prep: NONE SEEN
YEAST WET PREP: NONE SEEN

## 2016-10-17 NOTE — MAU Provider Note (Signed)
History     CSN: [id number]  Arrival date and time: 10/17/16 0044   First Provider Initiated Contact with Patient 10/17/16 0143      Chief Complaint  Patient presents with  . Pelvic Pain   Pelvic Pain  The patient's primary symptoms include pelvic pain. The patient's pertinent negatives include no vaginal discharge. This is a new problem. The current episode started today. The problem occurs constantly. The problem has been gradually improving. Pain severity now: 5/10  The problem affects both sides. She is pregnant. Associated symptoms include constipation (last  BM 2 days ago. ). Pertinent negatives include no chills, diarrhea, dysuria, fever, frequency, nausea, urgency or vomiting. The vaginal discharge was normal. There has been no bleeding. Nothing aggravates the symptoms. She has tried nothing for the symptoms. Sexual activity: No intercourse in the last 24 hours.    Past Medical History:  Diagnosis Date  . Allergy   . Bacterial vaginosis   . Urinary tract infection   . Vaginal yeast infection     Past Surgical History:  Procedure Laterality Date  . NO PAST SURGERIES      Family History  Problem Relation Age of Onset  . Diabetes Paternal Grandmother   . Depression Paternal Grandmother   . Drug abuse Paternal Grandmother   . Early death Paternal Grandmother   . Heart disease Paternal Grandmother   . Hypertension Paternal Grandmother   . Birth defects Mother   . Depression Mother   . Learning disabilities Mother   . Depression Father   . Learning disabilities Father   . Learning disabilities Sister   . Learning disabilities Brother   . Alcohol abuse Maternal Grandmother   . Depression Maternal Grandmother   . Mental illness Maternal Grandmother   . Arthritis Neg Hx   . Asthma Neg Hx   . Cancer Neg Hx   . COPD Neg Hx   . Hearing loss Neg Hx   . Hyperlipidemia Neg Hx   . Kidney disease Neg Hx   . Mental retardation Neg Hx   . Miscarriages / Stillbirths Neg Hx    . Stroke Neg Hx   . Vision loss Neg Hx   . Varicose Veins Neg Hx     Social History  Substance Use Topics  . Smoking status: Never Smoker  . Smokeless tobacco: Never Used  . Alcohol use No    Allergies: No Known Allergies  Prescriptions Prior to Admission  Medication Sig Dispense Refill Last Dose  . Prenatal Vit-Fe Fumarate-FA (PRENATAL COMPLETE) 14-0.4 MG TABS Take 1 tablet by mouth daily. 60 each 0 Past Week at Unknown time  . albuterol (PROVENTIL HFA;VENTOLIN HFA) 108 (90 BASE) MCG/ACT inhaler Inhale 2 puffs into the lungs every 6 (six) hours as needed for wheezing or shortness of breath (emergency asthma medication).    More than a month at Unknown time  . metoCLOPramide (REGLAN) 10 MG tablet Take 1 tablet (10 mg total) by mouth 3 (three) times daily as needed for nausea (headache). (Patient not taking: Reported on 09/02/2016) 10 tablet 0 More than a month at Unknown time  . Prenatal Vit-Fe Fumarate-FA (PRENATAL COMPLETE) 14-0.4 MG TABS Take 1 tablet by mouth daily. 60 each 0   . Prenatal Vit-Fe Fumarate-FA (PRENATAL MULTIVITAMIN) TABS tablet Take 1 tablet by mouth daily at 12 noon.   09/01/2016 at Unknown time    Review of Systems  Constitutional: Negative for chills and fever.  Gastrointestinal: Positive for constipation (last  BM 2  days ago. ). Negative for diarrhea, nausea and vomiting.  Genitourinary: Positive for pelvic pain. Negative for dysuria, frequency, urgency, vaginal bleeding and vaginal discharge.   Physical Exam   Blood pressure 104/70, pulse 67, temperature 98.3 F (36.8 C), resp. rate 16, height 5\' 3"  (1.6 m), weight 118 lb (53.5 kg), last menstrual period 05/30/2016, SpO2 100 %, unknown if currently breastfeeding.  Physical Exam  Nursing note and vitals reviewed. Constitutional: She is oriented to person, place, and time. She appears well-developed and well-nourished. No distress.  HENT:  Head: Normocephalic.  Cardiovascular: Normal rate.   Respiratory:  Effort normal.  GI: Soft. There is no tenderness. There is no rebound.  Genitourinary:  Genitourinary Comments: External: no lesion Vagina: small amount of white discharge Cervix: pink, smooth, closed/thick  Uterus: AGA FHT: 123 with doppler   Neurological: She is alert and oriented to person, place, and time.  Skin: Skin is warm and dry.  Psychiatric: She has a normal mood and affect.    Results for orders placed or performed during the hospital encounter of 10/17/16 (from the past 24 hour(s))  Urinalysis, Routine w reflex microscopic     Status: Abnormal   Collection Time: 10/17/16  1:20 AM  Result Value Ref Range   Color, Urine YELLOW YELLOW   APPearance CLOUDY (A) CLEAR   Specific Gravity, Urine 1.025 1.005 - 1.030   pH 6.0 5.0 - 8.0   Glucose, UA NEGATIVE NEGATIVE mg/dL   Hgb urine dipstick NEGATIVE NEGATIVE   Bilirubin Urine NEGATIVE NEGATIVE   Ketones, ur NEGATIVE NEGATIVE mg/dL   Protein, ur 30 (A) NEGATIVE mg/dL   Nitrite NEGATIVE NEGATIVE   Leukocytes, UA LARGE (A) NEGATIVE   RBC / HPF 6-30 0 - 5 RBC/hpf   WBC, UA TOO NUMEROUS TO COUNT 0 - 5 WBC/hpf   Bacteria, UA FEW (A) NONE SEEN   Squamous Epithelial / LPF 6-30 (A) NONE SEEN   Mucous PRESENT   Wet prep, genital     Status: Abnormal   Collection Time: 10/17/16  2:30 AM  Result Value Ref Range   Yeast Wet Prep HPF POC NONE SEEN NONE SEEN   Trich, Wet Prep NONE SEEN NONE SEEN   Clue Cells Wet Prep HPF POC NONE SEEN NONE SEEN   WBC, Wet Prep HPF POC MANY (A) NONE SEEN   Sperm NONE SEEN     MAU Course  Procedures  MDM   Assessment and Plan   1. [redacted] weeks gestation of pregnancy   2. Round ligament pain    DC home Comfort measures reviewed  2nd/3rd Trimester precautions  PTL precautions  Fetal kick counts RX: none  Return to MAU as needed FU with OB as planned  Follow-up Information    Essie HartPinn, Walda, MD Follow up.   Specialty:  Obstetrics and Gynecology Contact information: [address] [place] [id number] [phone]            Thressa ShellerHeather Calynn Ferrero 10/17/2016, 1:43 AM

## 2016-10-17 NOTE — Discharge Instructions (Signed)

## 2016-10-17 NOTE — MAU Note (Addendum)
PT SAYS AT 950PM-  WHILE IN BED   SHE STARTED FEELING PAINFUL TIGHTENING - THEN  TIME TO HOSPITAL- PAIN IS  LESS.  [facility]UG   [facility]

## 2016-10-17 NOTE — MAU Note (Signed)
Pt reports tightening in her stomach for the last 4 hours. Denies bleeding . Denies dysuria.

## 2016-10-18 LAB — GC/CHLAMYDIA PROBE AMP (~~LOC~~) NOT AT ARMC
Chlamydia: POSITIVE — AB
Neisseria Gonorrhea: NEGATIVE

## 2016-11-28 ENCOUNTER — Inpatient Hospital Stay (HOSPITAL_COMMUNITY)
Admission: AD | Admit: 2016-11-28 | Discharge: 2016-11-28 | Disposition: A | Discharge status: Home / Self Care | Payer: Medicaid Other | Source: Ambulatory Visit | Attending: Obstetrics & Gynecology | Admitting: Obstetrics & Gynecology

## 2016-11-28 ENCOUNTER — Encounter (HOSPITAL_COMMUNITY): Payer: Self-pay | Admitting: *Deleted

## 2016-11-28 DIAGNOSIS — Z833 Family history of diabetes mellitus: Secondary | ICD-10-CM | POA: Insufficient documentation

## 2016-11-28 DIAGNOSIS — Z3A27 27 weeks gestation of pregnancy: Secondary | ICD-10-CM | POA: Diagnosis not present

## 2016-11-28 DIAGNOSIS — Z8619 Personal history of other infectious and parasitic diseases: Secondary | ICD-10-CM | POA: Insufficient documentation

## 2016-11-28 DIAGNOSIS — Z813 Family history of other psychoactive substance abuse and dependence: Secondary | ICD-10-CM | POA: Insufficient documentation

## 2016-11-28 DIAGNOSIS — Z8279 Family history of other congenital malformations, deformations and chromosomal abnormalities: Secondary | ICD-10-CM | POA: Diagnosis not present

## 2016-11-28 DIAGNOSIS — Z818 Family history of other mental and behavioral disorders: Secondary | ICD-10-CM | POA: Diagnosis not present

## 2016-11-28 DIAGNOSIS — O368120 Decreased fetal movements, second trimester, not applicable or unspecified: Secondary | ICD-10-CM | POA: Diagnosis not present

## 2016-11-28 DIAGNOSIS — Z811 Family history of alcohol abuse and dependence: Secondary | ICD-10-CM | POA: Diagnosis not present

## 2016-11-28 DIAGNOSIS — Z3689 Encounter for other specified antenatal screening: Secondary | ICD-10-CM

## 2016-11-28 DIAGNOSIS — Z8249 Family history of ischemic heart disease and other diseases of the circulatory system: Secondary | ICD-10-CM | POA: Insufficient documentation

## 2016-11-28 NOTE — MAU Note (Signed)
Pt C/O decreased fetal movement since yesterday, Has had cramping, ongoing.  Denies bleeding or LOF.

## 2016-11-28 NOTE — MAU Provider Note (Signed)
History     CSN: [id number]  Arrival date and time: 11/28/16 4098   First Provider Initiated Contact with Patient 11/28/16 1918      Chief Complaint  Patient presents with  . Decreased Fetal Movement   HPI   Ms.Gail Byrd is a 21 y.o. female G2P1001 @ [redacted]w[redacted]d here in MAU with decreased fetal movement. States the movements have been less movement than normal. Since her arrival she has been feeling the baby move normally.  Denies pain or vaginal bleeding.   OB History    Gravida Para Term Preterm AB Living   2 1 1     1    SAB TAB Ectopic Multiple Live Births         0 1      Past Medical History:  Diagnosis Date  . Allergy   . Bacterial vaginosis   . Urinary tract infection   . Vaginal yeast infection     Past Surgical History:  Procedure Laterality Date  . NO PAST SURGERIES      Family History  Problem Relation Age of Onset  . Diabetes Paternal Grandmother   . Depression Paternal Grandmother   . Drug abuse Paternal Grandmother   . Early death Paternal Grandmother   . Heart disease Paternal Grandmother   . Hypertension Paternal Grandmother   . Birth defects Mother   . Depression Mother   . Learning disabilities Mother   . Depression Father   . Learning disabilities Father   . Learning disabilities Sister   . Learning disabilities Brother   . Alcohol abuse Maternal Grandmother   . Depression Maternal Grandmother   . Mental illness Maternal Grandmother   . Arthritis Neg Hx   . Asthma Neg Hx   . Cancer Neg Hx   . COPD Neg Hx   . Hearing loss Neg Hx   . Hyperlipidemia Neg Hx   . Kidney disease Neg Hx   . Mental retardation Neg Hx   . Miscarriages / Stillbirths Neg Hx   . Stroke Neg Hx   . Vision loss Neg Hx   . Varicose Veins Neg Hx     Social History  Substance Use Topics  . Smoking status: Never Smoker  . Smokeless tobacco: Never Used  . Alcohol use No    Allergies: No Known Allergies  Prescriptions Prior to Admission  Medication Sig  Dispense Refill Last Dose  . albuterol (PROVENTIL HFA;VENTOLIN HFA) 108 (90 BASE) MCG/ACT inhaler Inhale 2 puffs into the lungs every 6 (six) hours as needed for wheezing or shortness of breath (emergency asthma medication).    More than a month at Unknown time  . metoCLOPramide (REGLAN) 10 MG tablet Take 1 tablet (10 mg total) by mouth 3 (three) times daily as needed for nausea (headache). (Patient not taking: Reported on 09/02/2016) 10 tablet 0 More than a month at Unknown time  . Prenatal Vit-Fe Fumarate-FA (PRENATAL COMPLETE) 14-0.4 MG TABS Take 1 tablet by mouth daily. 60 each 0 Past Week at Unknown time  . Prenatal Vit-Fe Fumarate-FA (PRENATAL COMPLETE) 14-0.4 MG TABS Take 1 tablet by mouth daily. 60 each 0   . Prenatal Vit-Fe Fumarate-FA (PRENATAL MULTIVITAMIN) TABS tablet Take 1 tablet by mouth daily at 12 noon.   09/01/2016 at Unknown time   No results found for this or any previous visit (from the past 48 hour(s)).  Review of Systems  Constitutional: Negative for fever.  Gastrointestinal: Negative for abdominal pain.  Genitourinary: Negative for vaginal  bleeding and vaginal discharge.   Physical Exam   Blood pressure 108/67, pulse 99, temperature 98.2 F (36.8 C), temperature source Oral, resp. rate 18, last menstrual period 05/30/2016, unknown if currently breastfeeding.  Physical Exam  Constitutional: She is oriented to person, place, and time. She appears well-developed and well-nourished. No distress.  HENT:  Head: Normocephalic.  Eyes: Pupils are equal, round, and reactive to light.  Neck: Neck supple.  Respiratory: Effort normal.  GI: Soft. She exhibits no distension. There is no tenderness. There is no rebound.  Musculoskeletal: Normal range of motion.  Neurological: She is alert and oriented to person, place, and time.  Skin: Skin is warm. She is not diaphoretic.  Psychiatric: Her behavior is normal.   Fetal Tracing: Baseline: 130 bpm Variability: Moderate   Accelerations: 15x15 Decelerations: none Toco: none   MAU Course  Procedures  None  MDM  Reactive NST Discussed with Dr. Mora ApplPinn  Assessment and Plan   A:  1. Decreased fetal movements in second trimester, single or unspecified fetus   2. NST (non-stress test) reactive     P:  Discharge home in stable condition Strict return precautions Follow up with OB Kick counts at home  Gail Byrd, Harolyn RutherfordJennifer I, NP 11/28/2016 7:32 PM

## 2016-11-28 NOTE — Discharge Instructions (Signed)

## 2016-12-06 LAB — OB RESULTS CONSOLE RPR: RPR: NONREACTIVE

## 2016-12-14 LAB — OB RESULTS CONSOLE GC/CHLAMYDIA: CHLAMYDIA, DNA PROBE: NEGATIVE

## 2016-12-23 ENCOUNTER — Observation Stay (HOSPITAL_COMMUNITY)
Admission: AD | Admit: 2016-12-23 | Discharge: 2016-12-25 | Disposition: A | Discharge status: Home / Self Care | Payer: Medicaid Other | Source: Ambulatory Visit | Attending: Obstetrics and Gynecology | Admitting: Obstetrics and Gynecology

## 2016-12-23 ENCOUNTER — Encounter (HOSPITAL_COMMUNITY): Payer: Self-pay | Admitting: *Deleted

## 2016-12-23 DIAGNOSIS — L299 Pruritus, unspecified: Secondary | ICD-10-CM | POA: Insufficient documentation

## 2016-12-23 DIAGNOSIS — N898 Other specified noninflammatory disorders of vagina: Secondary | ICD-10-CM | POA: Insufficient documentation

## 2016-12-23 DIAGNOSIS — O26893 Other specified pregnancy related conditions, third trimester: Secondary | ICD-10-CM | POA: Diagnosis present

## 2016-12-23 DIAGNOSIS — O409XX0 Polyhydramnios, unspecified trimester, not applicable or unspecified: Secondary | ICD-10-CM

## 2016-12-23 DIAGNOSIS — R1084 Generalized abdominal pain: Secondary | ICD-10-CM | POA: Diagnosis not present

## 2016-12-23 DIAGNOSIS — Z3A30 30 weeks gestation of pregnancy: Secondary | ICD-10-CM | POA: Insufficient documentation

## 2016-12-23 DIAGNOSIS — O4703 False labor before 37 completed weeks of gestation, third trimester: Secondary | ICD-10-CM | POA: Diagnosis present

## 2016-12-23 DIAGNOSIS — Z79899 Other long term (current) drug therapy: Secondary | ICD-10-CM | POA: Diagnosis not present

## 2016-12-23 LAB — COMPREHENSIVE METABOLIC PANEL
ALT: 16 U/L (ref 14–54)
ANION GAP: 10 (ref 5–15)
AST: 23 U/L (ref 15–41)
Albumin: 2.7 g/dL — ABNORMAL LOW (ref 3.5–5.0)
Alkaline Phosphatase: 93 U/L (ref 38–126)
BUN: 7 mg/dL (ref 6–20)
CO2: 23 mmol/L (ref 22–32)
Calcium: 8.6 mg/dL — ABNORMAL LOW (ref 8.9–10.3)
Chloride: 104 mmol/L (ref 101–111)
Creatinine, Ser: 0.41 mg/dL — ABNORMAL LOW (ref 0.44–1.00)
GFR calc non Af Amer: >60 mL/min (ref >60)
GLUCOSE: 76 mg/dL (ref 65–99)
POTASSIUM: 3.5 mmol/L (ref 3.5–5.1)
SODIUM: 137 mmol/L (ref 135–145)
TOTAL PROTEIN: 5.8 g/dL — AB (ref 6.5–8.1)
Total Bilirubin: 0.6 mg/dL (ref 0.3–1.2)

## 2016-12-23 LAB — URINALYSIS, ROUTINE W REFLEX MICROSCOPIC
Glucose, UA: 50 mg/dL — AB
Hgb urine dipstick: NEGATIVE
Ketones, ur: NEGATIVE mg/dL
Nitrite: NEGATIVE
Protein, ur: 100 mg/dL — AB
SPECIFIC GRAVITY, URINE: 1.027 (ref 1.005–1.030)
pH: 6 (ref 5.0–8.0)

## 2016-12-23 LAB — WET PREP, GENITAL
CLUE CELLS WET PREP: NONE SEEN
SPERM: NONE SEEN
TRICH WET PREP: NONE SEEN
YEAST WET PREP: NONE SEEN

## 2016-12-23 LAB — FETAL FIBRONECTIN: Fetal Fibronectin: POSITIVE — AB

## 2016-12-23 MED ORDER — NIFEDIPINE 10 MG PO CAPS
10.0000 mg | ORAL_CAPSULE | Freq: Once | ORAL | Status: AC
  Administered 2016-12-23: 10 mg via ORAL
  Filled 2016-12-23: qty 1

## 2016-12-23 MED ORDER — NIFEDIPINE 10 MG PO CAPS
10.0000 mg | ORAL_CAPSULE | ORAL | Status: DC | PRN
  Administered 2016-12-23 (×2): 10 mg via ORAL
  Filled 2016-12-23 (×2): qty 1

## 2016-12-23 MED ORDER — BETAMETHASONE SOD PHOS & ACET 6 (3-3) MG/ML IJ SUSP
12.0000 mg | INTRAMUSCULAR | Status: AC
  Administered 2016-12-23 – 2016-12-24 (×2): 12 mg via INTRAMUSCULAR
  Filled 2016-12-23 (×2): qty 2

## 2016-12-23 MED ORDER — LACTATED RINGERS IV SOLN
INTRAVENOUS | Status: DC
  Administered 2016-12-23: 500 mL via INTRAVENOUS

## 2016-12-23 NOTE — MAU Provider Note (Signed)
History     CSN: [id number]  Arrival date and time: 12/23/16 1818   First Provider Initiated Contact with Patient 12/23/16 2001      Chief Complaint  Patient presents with  . Contractions  . Vaginal Discharge   HPI Gail Byrd 21 y.o. [redacted]w[redacted]d  Noticed that she had contractions at 1 am today.  Thought if she rested, they would go away.  Came in tonight as she has had these contractions all day.  Is here with her child and her mother.  Denies any vaginal bleeding.  Is not leaking.  Had a lot of vaginal discharge today.  Has not had intercourse since May.  OB History    Gravida Para Term Preterm AB Living   SAB TAB Ectopic Multiple Live Births         0 1      Past Medical History:  Diagnosis Date  . Allergy   . Bacterial vaginosis   . Urinary tract infection   . Vaginal yeast infection     Past Surgical History:  Procedure Laterality Date  . NO PAST SURGERIES      Family History  Problem Relation Age of Onset  . Diabetes Paternal Grandmother   . Depression Paternal Grandmother   . Drug abuse Paternal Grandmother   . Early death Paternal Grandmother   . Heart disease Paternal Grandmother   . Hypertension Paternal Grandmother   . Birth defects Mother   . Depression Mother   . Learning disabilities Mother   . Depression Father   . Learning disabilities Father   . Learning disabilities Sister   . Learning disabilities Brother   . Alcohol abuse Maternal Grandmother   . Depression Maternal Grandmother   . Mental illness Maternal Grandmother   . Arthritis Neg Hx   . Asthma Neg Hx   . Cancer Neg Hx   . COPD Neg Hx   . Hearing loss Neg Hx   . Hyperlipidemia Neg Hx   . Kidney disease Neg Hx   . Mental retardation Neg Hx   . Miscarriages / Stillbirths Neg Hx   . Stroke Neg Hx   . Vision loss Neg Hx   . Varicose Veins Neg Hx     Social History  Substance Use Topics  . Smoking status: Never Smoker  . Smokeless tobacco: Never Used  .  Alcohol use No    Allergies: No Known Allergies  Prescriptions Prior to Admission  Medication Sig Dispense Refill Last Dose  . albuterol (PROVENTIL HFA;VENTOLIN HFA) 108 (90 BASE) MCG/ACT inhaler Inhale 2 puffs into the lungs every 6 (six) hours as needed for wheezing or shortness of breath (emergency asthma medication).    More than a month at Unknown time  . metoCLOPramide (REGLAN) 10 MG tablet Take 1 tablet (10 mg total) by mouth 3 (three) times daily as needed for nausea (headache). (Patient not taking: Reported on 09/02/2016) 10 tablet 0 More than a month at Unknown time  . Prenatal Vit-Fe Fumarate-FA (PRENATAL COMPLETE) 14-0.4 MG TABS Take 1 tablet by mouth daily. 60 each 0 Past Week at Unknown time  . Prenatal Vit-Fe Fumarate-FA (PRENATAL COMPLETE) 14-0.4 MG TABS Take 1 tablet by mouth daily. 60 each 0   . Prenatal Vit-Fe Fumarate-FA (PRENATAL MULTIVITAMIN) TABS tablet Take 1 tablet by mouth daily at 12 noon.   09/01/2016 at Unknown time    Review of Systems  Constitutional: Negative for fever.  Gastrointestinal: Negative for diarrhea, nausea and vomiting.  Genitourinary: Positive for vaginal discharge. Negative for vaginal bleeding.       No leaking of fluid   Physical Exam   Blood pressure 112/66, pulse (!) 112, temperature 98.3 F (36.8 C), temperature source Oral, resp. rate 18, height  (1.6 m), weight 126 lb 1.9 oz (57.2 kg), last menstrual period 05/30/2016, unknown if currently breastfeeding.  Physical Exam  Nursing note and vitals reviewed. Constitutional: She is oriented to person, place, and time. She appears well-developed and well-nourished.  HENT:  Head: Normocephalic.  Eyes: EOM are normal.  Neck: Neck supple.  GI: Soft. There is no tenderness.  Fetal monitor applied - contractions 3-6 minutes apart.  Feel very firm 30-45 sec.  FHT baseline is 135 with moderate variability and 15x15 accels - reactive strip.  Genitourinary:  Genitourinary Comments: Speculum  exam: Vagina - Small amount of white discharge, no odor Cervix - No contact bleeding, cervix appears closed Bimanual exam: Cervix 1 cm, thick, soft, presenting part is high and ballotable FFN, wet prep done Chaperone present for exam.   Musculoskeletal: Normal range of motion.  Neurological: She is alert and oriented to person, place, and time.  Skin: Skin is warm and dry.  Psychiatric: She has a normal mood and affect.    MAU Course  Procedures  MDM 2025  Consult with Dr. Henderson Cloud - reviewed orders, will do bethamethasone if FFN is positive.  Care assumed by L Courtney Paris, CNM at 2130  Assessment and Plan    Gail Byrd 12/23/2016, 8:02 PM   MDM Procardia 10 mg x 3 doses Q 20 minutes and LR x 1000 ml given, with some change in contractions but still Q 5-7 minutes and painful. Cervix unchanged in 3 + hours in MAU but pt with painful contractions and FFN positive. BMZ first dose given. Pt reports polyhydramnios on [place] in the office.  Consult Dr Henderson Cloud. Observe overnight, continuous EFM/toco, NPO, IV at [facility], Procardia Q 4 hours PRN, Complete US in the am.   A: 1. Preterm uterine contractions in third trimester, antepartum   2. Threatened preterm labor, third trimester     P: Admit for observation BMZ second injection ordered for 24 hours from dose given in MAU

## 2016-12-23 NOTE — MAU Note (Signed)
Patient presents with abdominal cramping, stomach tightening, itching all over and slimey like discharge.

## 2016-12-24 ENCOUNTER — Encounter (HOSPITAL_COMMUNITY): Payer: Self-pay

## 2016-12-24 ENCOUNTER — Observation Stay (HOSPITAL_COMMUNITY): Payer: Medicaid Other

## 2016-12-24 DIAGNOSIS — O4703 False labor before 37 completed weeks of gestation, third trimester: Secondary | ICD-10-CM | POA: Diagnosis present

## 2016-12-24 LAB — TYPE AND SCREEN
ABO/RH(D): O POS
ANTIBODY SCREEN: NEGATIVE

## 2016-12-24 MED ORDER — NIFEDIPINE 10 MG PO CAPS
10.0000 mg | ORAL_CAPSULE | ORAL | Status: AC | PRN
  Administered 2016-12-24: 10 mg via ORAL
  Filled 2016-12-24: qty 1

## 2016-12-24 MED ORDER — ZOLPIDEM TARTRATE 5 MG PO TABS: 5.0000 mg | ORAL_TABLET | Freq: Every evening | ORAL | Status: DC | PRN

## 2016-12-24 MED ORDER — ACETAMINOPHEN 325 MG PO TABS
650.0000 mg | ORAL_TABLET | ORAL | Status: DC | PRN
  Administered 2016-12-24: 650 mg via ORAL
  Filled 2016-12-24: qty 2

## 2016-12-24 MED ORDER — LACTATED RINGERS IV SOLN
INTRAVENOUS | Status: DC
  Administered 2016-12-24 – 2016-12-25 (×3): via INTRAVENOUS

## 2016-12-24 MED ORDER — PRENATAL MULTIVITAMIN CH
1.0000 | ORAL_TABLET | Freq: Every day | ORAL | Status: DC
  Administered 2016-12-24 – 2016-12-25 (×2): 1 via ORAL
  Filled 2016-12-24 (×2): qty 1

## 2016-12-24 MED ORDER — DOCUSATE SODIUM 100 MG PO CAPS
100.0000 mg | ORAL_CAPSULE | Freq: Every day | ORAL | Status: DC
  Filled 2016-12-24 (×2): qty 1

## 2016-12-24 MED ORDER — CALCIUM CARBONATE ANTACID 500 MG PO CHEW: 2.0000 | CHEWABLE_TABLET | ORAL | Status: DC | PRN

## 2016-12-24 NOTE — Progress Notes (Addendum)
Late entry from rounding this morning:  Pt reports that contractions have resolved.  She denies LOF, VB and reports that the baby is moving well.   Vitals:   12/24/16 1210 12/24/16 1641 12/24/16 1935 12/24/16 2003  BP: 102/65 (!) 98/58  (!) 101/42  Pulse: 92 (!) 101  89  Resp: Temp: (!) 97.4 F (36.3 C) 98.7 F (37.1 C)  98.2 F (36.8 C)  TempSrc: Oral Oral  Oral  SpO2: 100% 98%  96%  Weight:      Height:        Lab Results  Component Value Date   WBC 7.1 09/02/2016   HGB 11.8 (L) 09/02/2016   HCT 34.7 (L) 09/02/2016   MCV 86.5 09/02/2016   PLT 259 09/02/2016   Gen: NAD, resting comfortably Abd: gravid, NT Ext:no CT FHT currently 130 mod var, + accels no decels, occ. Shallow variable earlier today TOCO: currently quiet, this am ctx ranging to q26min, then irregular, now rare Cervix: not rechecked  HD#2 30.6 with preterm contractions Received Procardia  x 3 last night, per RN no further tocolytic administered S/p betamethasone x 1 last night, should receive 2nd dose tonight Continue EFM/TOCO Will continue to monitor overnight and if unchanged plan for d/c tomorrow.   Philip Aspen

## 2016-12-24 NOTE — H&P (Signed)
Please refer to NMW below.  Briefly, this is a 21 y.o. G2P1001 [redacted]w[redacted]d who came in for contractions.  Although cervix was 1 per NMW, it was thick.  Contractions did not stop and FFN was +.  Pt admitted for obs for contractions and was given BMZ.  If she changes her cervix, she will be candidate for magnesium sulfate.    HPI Gail Byrd 21 y.o. [redacted]w[redacted]d  Noticed that she had contractions at 1 am today.  Thought if she rested, they would go away.  Came in tonight as she has had these contractions all day.  Is here with her child and her mother.  Denies any vaginal bleeding.  Is not leaking.  Had a lot of vaginal discharge today.  Has not had intercourse since May.          OB History    Gravida Para Term Preterm AB Living   SAB TAB Ectopic Multiple Live Births         0 1          Past Medical History:  Diagnosis Date  . Allergy   . Bacterial vaginosis   . Urinary tract infection   . Vaginal yeast infection          Past Surgical History:  Procedure Laterality Date  . NO PAST SURGERIES           Family History  Problem Relation Age of Onset  . Diabetes Paternal Grandmother   . Depression Paternal Grandmother   . Drug abuse Paternal Grandmother   . Early death Paternal Grandmother   . Heart disease Paternal Grandmother   . Hypertension Paternal Grandmother   . Birth defects Mother   . Depression Mother   . Learning disabilities Mother   . Depression Father   . Learning disabilities Father   . Learning disabilities Sister   . Learning disabilities Brother   . Alcohol abuse Maternal Grandmother   . Depression Maternal Grandmother   . Mental illness Maternal Grandmother   . Arthritis Neg Hx   . Asthma Neg Hx   . Cancer Neg Hx   . COPD Neg Hx   . Hearing loss Neg Hx   . Hyperlipidemia Neg Hx   . Kidney disease Neg Hx   . Mental retardation Neg Hx   . Miscarriages / Stillbirths Neg Hx   . Stroke Neg Hx   .  Vision loss Neg Hx   . Varicose Veins Neg Hx         Social History  Substance Use Topics  . Smoking status: Never Smoker  . Smokeless tobacco: Never Used  . Alcohol use No    Allergies: No Known Allergies         Prescriptions Prior to Admission  Medication Sig Dispense Refill Last Dose  . albuterol (PROVENTIL HFA;VENTOLIN HFA) 108 (90 BASE) MCG/ACT inhaler Inhale 2 puffs into the lungs every 6 (six) hours as needed for wheezing or shortness of breath (emergency asthma medication).    More than a month at Unknown time  . metoCLOPramide (REGLAN) 10 MG tablet Take 1 tablet (10 mg total) by mouth 3 (three) times daily as needed for nausea (headache). (Patient not taking: Reported on 09/02/2016) 10 tablet 0 More than a month at Unknown time  . Prenatal Vit-Fe Fumarate-FA (PRENATAL COMPLETE) 14-0.4 MG TABS Take 1 tablet by mouth daily. 60 each 0 Past Week at Unknown time  .  Prenatal Vit-Fe Fumarate-FA (PRENATAL COMPLETE) 14-0.4 MG TABS Take 1 tablet by mouth daily. 60 each 0   . Prenatal Vit-Fe Fumarate-FA (PRENATAL MULTIVITAMIN) TABS tablet Take 1 tablet by mouth daily at 12 noon.   09/01/2016 at Unknown time    Review of Systems  Constitutional: Negative for fever.  Gastrointestinal: Negative for diarrhea, nausea and vomiting.  Genitourinary: Positive for vaginal discharge. Negative for vaginal bleeding.       No leaking of fluid   Physical Exam   Blood pressure 112/66, pulse (!) 112, temperature 98.3 F (36.8 C), temperature source Oral, resp. rate 18, height  (1.6 m), weight 126 lb 1.9 oz (57.2 kg), last menstrual period 05/30/2016, unknown if currently breastfeeding.  Physical Exam  Nursing note and vitals reviewed. Constitutional: She is oriented to person, place, and time. She appears well-developed and well-nourished.  HENT:  Head: Normocephalic.  Eyes: EOM are normal.  Neck: Neck supple.  GI: Soft. There is no tenderness.  Fetal monitor applied -  contractions 3-6 minutes apart.  Feel very firm 30-45 sec.  FHT baseline is 135 with moderate variability and 15x15 accels - reactive strip.  Genitourinary:  Genitourinary Comments: Speculum exam: Vagina - Small amount of white discharge, no odor Cervix - No contact bleeding, cervix appears closed Bimanual exam: Cervix 1 cm, thick, soft, presenting part is high and ballotable FFN, wet prep done Chaperone present for exam.   Musculoskeletal: Normal range of motion.  Neurological: She is alert and oriented to person, place, and time.  Skin: Skin is warm and dry.  Psychiatric: She has a normal mood and affect.    MAU Course  Procedures  MDM 2025  Consult with Dr. Henderson Cloud - reviewed orders, will do bethamethasone if FFN is positive.  Care assumed by L Courtney Paris, CNM at 2130  Assessment and Plan    Gail Byrd 12/23/2016, 8:02 PM   MDM Procardia 10 mg x 3 doses Q 20 minutes and LR x 1000 ml given, with some change in contractions but still Q 5-7 minutes and painful. Cervix unchanged in 3 + hours in MAU but pt with painful contractions and FFN positive. BMZ first dose given. Pt reports polyhydramnios on [place] in the office.  Consult Dr Henderson Cloud. Observe overnight, continuous EFM/toco, NPO, IV at [facility], Procardia Q 4 hours PRN, Complete US in the am.   A: 1. Preterm uterine contractions in third trimester, antepartum   2. Threatened preterm labor, third trimester     P: Admit for observation BMZ second injection ordered for 24 hours from dose given in MAU

## 2016-12-24 NOTE — Plan of Care (Signed)
Problem: Education: Goal: Knowledge of [facility] Education information/materials will improve Outcome: Progressing Pt oriented to room and routine. Encouraged and answered questions.   Problem: Pain Managment: Goal: General experience of comfort will improve Outcome: Progressing Medication given for pain control with relief experienced.  Problem: Physical Regulation: Goal: Will remain free from infection Outcome: Progressing Pt educated in good hygiene practices to help prevent infection.

## 2016-12-25 MED ORDER — CEFUROXIME AXETIL 250 MG PO TABS
250.0000 mg | ORAL_TABLET | Freq: Two times a day (BID) | ORAL | Status: DC
  Administered 2016-12-25: 250 mg via ORAL
  Filled 2016-12-25 (×3): qty 1

## 2016-12-25 MED ORDER — NIFEDIPINE 10 MG PO CAPS: 10.0000 mg | ORAL_CAPSULE | Freq: Four times a day (QID) | ORAL | 0 refills | Status: DC

## 2016-12-25 MED ORDER — CEFUROXIME AXETIL 250 MG PO TABS: 250.0000 mg | ORAL_TABLET | Freq: Two times a day (BID) | ORAL | 0 refills | Status: DC

## 2016-12-25 MED ORDER — NIFEDIPINE 10 MG PO CAPS
10.0000 mg | ORAL_CAPSULE | Freq: Once | ORAL | Status: AC
  Administered 2016-12-25: 10 mg via ORAL
  Filled 2016-12-25: qty 1

## 2016-12-25 NOTE — Discharge Summary (Signed)
Obstetric Discharge Summary Reason for Admission: observation/evaluation Prenatal Procedures: ultrasound Intrapartum Procedures: procardia   Hemoglobin  Date Value Ref Range Status  09/02/2016 11.8 (L) 12.0 - 15.0 g/dL Final   HCT  Date Value Ref Range Status  09/02/2016 34.7 (L) 36.0 - 46.0 % Final     Discharge Diagnoses: preterm contractions  Discharge Information: Date: 12/25/2016 Activity: pelvic rest Diet: routine Medications: procardia and ceftin Condition: stable Instructions: preterm labor Discharge to: home     Ethylene Reznick A 12/25/2016, 8:52 AM

## 2016-12-25 NOTE — Progress Notes (Signed)
21 y.o. G2P1001 9w0dHD#0 admitted for 30.4 WKS, CRAMPING, BACK PAIN, STOMACH TIGHTENING.  Pt currently stable but c/o some continued cramping.  Good FM.  Also she is still having itching all over, worse at night.  Vitals:   12/24/16 2003 12/24/16 2353 12/25/16 0600 12/25/16 0800  BP: (!) 101/42 (!) 98/56 (!) 98/47 104/63  Pulse: 89 (!) 103 82 93  Resp: 16 16 16 18   Temp: 98.2 F (36.8 C) 99.1 F (37.3 C) 98.4 F (36.9 C) 98 F (36.7 C)  TempSrc: Oral Oral Oral Oral  SpO2: 96% 98% 97% 97%  Weight:      Height:        Lungs CTA Cor RRR Abd  Soft, gravid, nontender Ex SCDs FHTs  120s, good short term variability, NST R Toco  Were q 7, now reduced  Results for orders placed or performed during the hospital encounter of 12/23/16 (from the past 72 hour(s))  Urinalysis, Routine w reflex microscopic     Status: Abnormal   Collection Time: 12/23/16  6:35 PM  Result Value Ref Range   Color, Urine AMBER (A) YELLOW    Comment: BIOCHEMICALS MAY BE AFFECTED BY COLOR   APPearance CLOUDY (A) CLEAR   Specific Gravity, Urine 1.027 1.005 - 1.030   pH 6.0 5.0 - 8.0   Glucose, UA 50 (A) NEGATIVE mg/dL   Hgb urine dipstick NEGATIVE NEGATIVE   Bilirubin Urine SMALL (A) NEGATIVE   Ketones, ur NEGATIVE NEGATIVE mg/dL   Protein, ur 100 (A) NEGATIVE mg/dL   Nitrite NEGATIVE NEGATIVE   Leukocytes, UA LARGE (A) NEGATIVE   RBC / HPF 0-5 0 - 5 RBC/hpf   WBC, UA TOO NUMEROUS TO COUNT 0 - 5 WBC/hpf   Bacteria, UA FEW (A) NONE SEEN   Squamous Epithelial / LPF TOO NUMEROUS TO COUNT (A) NONE SEEN   Mucus PRESENT   Wet prep, genital     Status: Abnormal   Collection Time: 12/23/16  8:15 PM  Result Value Ref Range   Yeast Wet Prep HPF POC NONE SEEN NONE SEEN   Trich, Wet Prep NONE SEEN NONE SEEN   Clue Cells Wet Prep HPF POC NONE SEEN NONE SEEN   WBC, Wet Prep HPF POC MODERATE (A) NONE SEEN    Comment: MODERATE BACTERIA SEEN   Sperm NONE SEEN   Fetal fibronectin     Status: Abnormal   Collection Time: 12/23/16  8:15 PM  Result Value Ref Range   Fetal Fibronectin POSITIVE (A) NEGATIVE  Comprehensive metabolic panel     Status: Abnormal   Collection Time: 12/23/16 10:40 PM  Result Value Ref Range   Sodium 137 135 - 145 mmol/L   Potassium 3.5 3.5 - 5.1 mmol/L   Chloride 104 101 - 111 mmol/L   CO2 23 22 - 32 mmol/L   Glucose, Bld 76 65 - 99 mg/dL   BUN 7 6 - 20 mg/dL   Creatinine, Ser 0.41 (L) 0.44 - 1.00 mg/dL   Calcium 8.6 (L) 8.9 - 10.3 mg/dL   Total Protein 5.8 (L) 6.5 - 8.1 g/dL   Albumin 2.7 (L) 3.5 - 5.0 g/dL   AST 23 15 - 41 U/L   ALT 16 14 - 54 U/L   Alkaline Phosphatase 93 38 - 126 U/L   Total Bilirubin 0.6 0.3 - 1.2 mg/dL   GFR calc non Af Amer >60 >60 mL/min   GFR calc Af Amer >60 >60 mL/min    Comment: (NOTE) The eGFR  has been calculated using the CKD EPI equation. This calculation has not been validated in all clinical situations. eGFR's persistently <60 mL/min signify possible Chronic Kidney Disease.    Anion gap 10 5 - 15  Type and screen [facility]     Status: None   Collection Time: 12/24/16 12:41 AM  Result Value Ref Range   ABO/RH(D) O POS    Antibody Screen NEG    Sample Expiration 12/27/2016     A:  HD#0  24w0dwith preterm contractions, evidence of UTI and itching.  P: 1.  Will give dose of procardia now for contractions.  If that stabilizes cramping, will send home with it. 2.  Itching- LFTS were normal and bile acids are pending. Continue benedryl.  Will need ursodiol if bile acids are elevated. 3.  UTI - ceftin bid for 7 days. 4.  D/c if better this afternoon.   Gail Byrd A

## 2016-12-25 NOTE — Progress Notes (Signed)
Discharge instructions reviewed with patient by [facility], RN.  Prescriptions, follow up appointments, signs of preterm labor discussed.  Patient verbalized understanding.  No other questions at this time.  Paperwork signed.  Patient ambulated off unit with family and RN

## 2016-12-25 NOTE — Progress Notes (Deleted)
21 y.o. G2P1001 [redacted]w[redacted]d HD#0 admitted for 30.4 WKS, CRAMPING, BACK PAIN, STOMACH TIGHTENING.  Pt currently stable with no c/o.  Good FM.  Vitals:   12/24/16 1935 12/24/16 2003 12/24/16 2353 12/25/16 0600  BP:  (!) 101/42 (!) 98/56 (!) 98/47  Pulse:  89 (!) 103 82  Resp: Temp:  98.2 F (36.8 C) 99.1 F (37.3 C) 98.4 F (36.9 C)  TempSrc:  Oral Oral Oral  SpO2:  96% 98% 97%  Weight:      Height:         Lungs CTA Cor RRR Abd  Soft, gravid, nontender Ex SCDs FHTs  120s, good short term variability, NST R Toco  none  No results found for this or any previous visit (from the past 24 hour(s)).  A:  HD#0  [redacted]w[redacted]d with preterm contractions but stopped with procardia.  S/p BMZ x2.  .  P: D/c to home.  Precautions given for contractions.     [name] A

## 2016-12-26 LAB — BILE ACIDS, TOTAL: Bile Acids Total: 9.8 umol/L (ref 4.7–24.5)

## 2017-01-30 LAB — OB RESULTS CONSOLE GBS: GBS: NEGATIVE

## 2017-02-17 ENCOUNTER — Encounter (HOSPITAL_COMMUNITY): Payer: Self-pay | Admitting: Anesthesiology

## 2017-02-17 ENCOUNTER — Inpatient Hospital Stay (HOSPITAL_COMMUNITY): Payer: Medicaid Other | Admitting: Anesthesiology

## 2017-02-17 ENCOUNTER — Inpatient Hospital Stay (HOSPITAL_COMMUNITY)
Admission: AD | Admit: 2017-02-17 | Discharge: 2017-02-19 | DRG: 807 | Disposition: A | Discharge status: Home / Self Care | Payer: Medicaid Other | Source: Ambulatory Visit | Attending: Obstetrics and Gynecology | Admitting: Obstetrics and Gynecology

## 2017-02-17 ENCOUNTER — Encounter (HOSPITAL_COMMUNITY): Payer: Self-pay | Admitting: *Deleted

## 2017-02-17 ENCOUNTER — Other Ambulatory Visit: Payer: Self-pay

## 2017-02-17 DIAGNOSIS — Z3A38 38 weeks gestation of pregnancy: Secondary | ICD-10-CM | POA: Diagnosis not present

## 2017-02-17 DIAGNOSIS — Z3490 Encounter for supervision of normal pregnancy, unspecified, unspecified trimester: Secondary | ICD-10-CM

## 2017-02-17 DIAGNOSIS — Z23 Encounter for immunization: Secondary | ICD-10-CM | POA: Diagnosis not present

## 2017-02-17 HISTORY — DX: Trichomoniasis, unspecified: A59.9

## 2017-02-17 HISTORY — DX: Chlamydial infection, unspecified: A74.9

## 2017-02-17 LAB — TYPE AND SCREEN
ABO/RH(D): O POS
ANTIBODY SCREEN: NEGATIVE

## 2017-02-17 LAB — CBC
HEMATOCRIT: 35 % — AB (ref 36.0–46.0)
HEMOGLOBIN: 11.9 g/dL — AB (ref 12.0–15.0)
MCH: 32 pg (ref 26.0–34.0)
MCHC: 34 g/dL (ref 30.0–36.0)
MCV: 94.1 fL (ref 78.0–100.0)
Platelets: 231 10*3/uL (ref 150–400)
RBC: 3.72 MIL/uL — ABNORMAL LOW (ref 3.87–5.11)
RDW: 13.2 % (ref 11.5–15.5)
WBC: 12.4 10*3/uL — ABNORMAL HIGH (ref 4.0–10.5)

## 2017-02-17 MED ORDER — WITCH HAZEL-GLYCERIN EX PADS: 1.0000 "application " | MEDICATED_PAD | CUTANEOUS | Status: DC | PRN

## 2017-02-17 MED ORDER — DIBUCAINE 1 % RE OINT: 1.0000 "application " | TOPICAL_OINTMENT | RECTAL | Status: DC | PRN

## 2017-02-17 MED ORDER — ONDANSETRON HCL 4 MG/2ML IJ SOLN: 4.0000 mg | INTRAMUSCULAR | Status: DC | PRN

## 2017-02-17 MED ORDER — OXYCODONE-ACETAMINOPHEN 5-325 MG PO TABS: 2.0000 | ORAL_TABLET | ORAL | Status: DC | PRN

## 2017-02-17 MED ORDER — ONDANSETRON HCL 4 MG PO TABS: 4.0000 mg | ORAL_TABLET | ORAL | Status: DC | PRN

## 2017-02-17 MED ORDER — SENNOSIDES-DOCUSATE SODIUM 8.6-50 MG PO TABS
2.0000 | ORAL_TABLET | ORAL | Status: DC
  Administered 2017-02-17 – 2017-02-18 (×2): 2 via ORAL
  Filled 2017-02-17 (×2): qty 2

## 2017-02-17 MED ORDER — PHENYLEPHRINE 40 MCG/ML (10ML) SYRINGE FOR IV PUSH (FOR BLOOD PRESSURE SUPPORT)
80.0000 ug | PREFILLED_SYRINGE | INTRAVENOUS | Status: DC | PRN
  Filled 2017-02-17: qty 5

## 2017-02-17 MED ORDER — COCONUT OIL OIL: 1.0000 "application " | TOPICAL_OIL | Status: DC | PRN

## 2017-02-17 MED ORDER — OXYTOCIN 40 UNITS IN LACTATED RINGERS INFUSION - SIMPLE MED: 2.5000 [IU]/h | INTRAVENOUS | Status: DC

## 2017-02-17 MED ORDER — EPHEDRINE 5 MG/ML INJ
10.0000 mg | INTRAVENOUS | Status: DC | PRN
  Filled 2017-02-17: qty 2

## 2017-02-17 MED ORDER — TERBUTALINE SULFATE 1 MG/ML IJ SOLN
0.2500 mg | Freq: Once | INTRAMUSCULAR | Status: DC | PRN
  Filled 2017-02-17: qty 1

## 2017-02-17 MED ORDER — BENZOCAINE-MENTHOL 20-0.5 % EX AERO
1.0000 "application " | INHALATION_SPRAY | CUTANEOUS | Status: DC | PRN
  Administered 2017-02-18: 1 via TOPICAL
  Filled 2017-02-17: qty 56

## 2017-02-17 MED ORDER — LACTATED RINGERS IV SOLN: 500.0000 mL | INTRAVENOUS | Status: DC | PRN

## 2017-02-17 MED ORDER — LIDOCAINE HCL (PF) 1 % IJ SOLN
INTRAMUSCULAR | Status: DC | PRN
  Administered 2017-02-17: 6 mL via EPIDURAL
  Administered 2017-02-17: 3 mL via EPIDURAL

## 2017-02-17 MED ORDER — ACETAMINOPHEN 325 MG PO TABS
650.0000 mg | ORAL_TABLET | ORAL | Status: DC | PRN
  Administered 2017-02-18 – 2017-02-19 (×2): 650 mg via ORAL
  Filled 2017-02-17 (×2): qty 2

## 2017-02-17 MED ORDER — FLEET ENEMA 7-19 GM/118ML RE ENEM: 1.0000 | ENEMA | RECTAL | Status: DC | PRN

## 2017-02-17 MED ORDER — TETANUS-DIPHTH-ACELL PERTUSSIS 5-2.5-18.5 LF-MCG/0.5 IM SUSP: 0.5000 mL | Freq: Once | INTRAMUSCULAR | Status: DC

## 2017-02-17 MED ORDER — OXYTOCIN BOLUS FROM INFUSION
500.0000 mL | Freq: Once | INTRAVENOUS | Status: AC
  Administered 2017-02-17: 500 mL via INTRAVENOUS

## 2017-02-17 MED ORDER — OXYTOCIN 40 UNITS IN LACTATED RINGERS INFUSION - SIMPLE MED
1.0000 m[IU]/min | INTRAVENOUS | Status: DC
  Administered 2017-02-17: 2 m[IU]/min via INTRAVENOUS
  Filled 2017-02-17: qty 1000

## 2017-02-17 MED ORDER — FENTANYL 2.5 MCG/ML BUPIVACAINE 1/10 % EPIDURAL INFUSION (WH - ANES)
INTRAMUSCULAR | Status: AC
  Filled 2017-02-17: qty 100

## 2017-02-17 MED ORDER — SIMETHICONE 80 MG PO CHEW: 80.0000 mg | CHEWABLE_TABLET | ORAL | Status: DC | PRN

## 2017-02-17 MED ORDER — LACTATED RINGERS IV SOLN
INTRAVENOUS | Status: DC
  Administered 2017-02-17: 11:00:00 via INTRAVENOUS

## 2017-02-17 MED ORDER — OXYCODONE-ACETAMINOPHEN 5-325 MG PO TABS: 1.0000 | ORAL_TABLET | ORAL | Status: DC | PRN

## 2017-02-17 MED ORDER — ZOLPIDEM TARTRATE 5 MG PO TABS: 5.0000 mg | ORAL_TABLET | Freq: Every evening | ORAL | Status: DC | PRN

## 2017-02-17 MED ORDER — IBUPROFEN 600 MG PO TABS
600.0000 mg | ORAL_TABLET | Freq: Four times a day (QID) | ORAL | Status: DC
  Administered 2017-02-17 – 2017-02-19 (×7): 600 mg via ORAL
  Filled 2017-02-17 (×7): qty 1

## 2017-02-17 MED ORDER — MEASLES, MUMPS & RUBELLA VAC ~~LOC~~ INJ
0.5000 mL | INJECTION | Freq: Once | SUBCUTANEOUS | Status: DC
  Filled 2017-02-17: qty 0.5

## 2017-02-17 MED ORDER — FENTANYL 2.5 MCG/ML BUPIVACAINE 1/10 % EPIDURAL INFUSION (WH - ANES)
14.0000 mL/h | INTRAMUSCULAR | Status: DC | PRN
  Administered 2017-02-17 (×2): 14 mL/h via EPIDURAL

## 2017-02-17 MED ORDER — ONDANSETRON HCL 4 MG/2ML IJ SOLN
4.0000 mg | Freq: Four times a day (QID) | INTRAMUSCULAR | Status: DC | PRN
  Administered 2017-02-17: 4 mg via INTRAVENOUS
  Filled 2017-02-17: qty 2

## 2017-02-17 MED ORDER — ACETAMINOPHEN 325 MG PO TABS: 650.0000 mg | ORAL_TABLET | ORAL | Status: DC | PRN

## 2017-02-17 MED ORDER — DIPHENHYDRAMINE HCL 50 MG/ML IJ SOLN: 12.5000 mg | INTRAMUSCULAR | Status: DC | PRN

## 2017-02-17 MED ORDER — LIDOCAINE HCL (PF) 1 % IJ SOLN
30.0000 mL | INTRAMUSCULAR | Status: DC | PRN
  Filled 2017-02-17: qty 30

## 2017-02-17 MED ORDER — LACTATED RINGERS IV SOLN: 500.0000 mL | Freq: Once | INTRAVENOUS | Status: DC

## 2017-02-17 MED ORDER — SOD CITRATE-CITRIC ACID 500-334 MG/5ML PO SOLN: 30.0000 mL | ORAL | Status: DC | PRN

## 2017-02-17 MED ORDER — PHENYLEPHRINE 40 MCG/ML (10ML) SYRINGE FOR IV PUSH (FOR BLOOD PRESSURE SUPPORT)
PREFILLED_SYRINGE | INTRAVENOUS | Status: AC
  Filled 2017-02-17: qty 10

## 2017-02-17 NOTE — Anesthesia Pain Management Evaluation Note (Signed)
  CRNA Pain Management Visit Note  Patient: Gail Byrd, 21 y.o., female  "Hello I am a [facility] of the anesthesia [facility]. We have an anesthesia team available at all times to provide care throughout the hospital, including epidural management and anesthesia for C-section. I don't know your plan for the delivery whether it a natural birth, water birth, IV sedation, nitrous supplementation, doula or epidural, but we want to meet your pain goals."   1.Was your pain managed to your expectations on prior hospitalizations?   No prior hospitalizations  2.What is your expectation for pain management during this hospitalization?     Epidural  3.How can we help you reach that goal? Support prn  Record the patient's initial score and the patient's pain goal.   Pain: 9  Pain Goal: 2 [facility] wants you to be able to say your pain was always managed very well.  [facility] HospitalWRINKLE,Alisea Matte 02/17/2017

## 2017-02-17 NOTE — Anesthesia Preprocedure Evaluation (Signed)
Anesthesia Evaluation  Patient identified by MRN, date of birth, ID band Patient awake    Reviewed: Allergy & Precautions, H&P , NPO status , Patient's Chart, lab work & pertinent test results  Airway Mallampati: I  TM Distance: >3 FB Neck ROM: full    Dental no notable dental hx. (+) Teeth Intact   Pulmonary neg pulmonary ROS,    Pulmonary exam normal breath sounds clear to auscultation       Cardiovascular negative cardio ROS Normal cardiovascular exam Rhythm:Regular Rate:Normal     Neuro/Psych negative neurological ROS  negative psych ROS   GI/Hepatic negative GI ROS, Neg liver ROS,   Endo/Other  negative endocrine ROS  Renal/GU negative Renal ROS     Musculoskeletal   Abdominal   Peds  Hematology negative hematology ROS (+)   Anesthesia Other Findings   Reproductive/Obstetrics (+) Pregnancy                             Anesthesia Physical Anesthesia Plan  ASA: II  Anesthesia Plan: Epidural   Post-op Pain Management:    Induction:   PONV Risk Score and Plan:   Airway Management Planned:   Additional Equipment:   Intra-op Plan:   Post-operative Plan:   Informed Consent: I have reviewed the patients History and Physical, chart, labs and discussed the procedure including the risks, benefits and alternatives for the proposed anesthesia with the patient or authorized representative who has indicated his/her understanding and acceptance.     Plan Discussed with:   Anesthesia Plan Comments:         Anesthesia Quick Evaluation

## 2017-02-17 NOTE — H&P (Signed)
Pt is an 21 y.o. G2P1001 5759w5d white female.  Who presents to the ER c/o a change in baby movements. Initially the fetal baseline was in the 110s. It subsequently increased to the 120s and became reactive. [facility] She had + CT early in preg . She also had PTL and was given betamethasone.  Chief Complaint: HPI:  Past Medical History:  Diagnosis Date  . Allergy   . Bacterial vaginosis   . Chlamydia   . Trichomonas infection   . Urinary tract infection   . Vaginal yeast infection     Past Surgical History:  Procedure Laterality Date  . NO PAST SURGERIES      Family History  Problem Relation Age of Onset  . Diabetes Paternal Grandmother   . Depression Paternal Grandmother   . Drug abuse Paternal Grandmother   . Early death Paternal Grandmother   . Heart disease Paternal Grandmother   . Hypertension Paternal Grandmother   . Birth defects Mother   . Depression Mother   . Learning disabilities Mother   . Learning disabilities Father   . Learning disabilities Sister   . Learning disabilities Brother   . Alcohol abuse Maternal Grandmother   . Depression Maternal Grandmother   . Mental illness Maternal Grandmother   . Arthritis Neg Hx   . Asthma Neg Hx   . Cancer Neg Hx   . COPD Neg Hx   . Hearing loss Neg Hx   . Hyperlipidemia Neg Hx   . Kidney disease Neg Hx   . Mental retardation Neg Hx   . Miscarriages / Stillbirths Neg Hx   . Stroke Neg Hx   . Vision loss Neg Hx   . Varicose Veins Neg Hx    Social History:  reports that  has never smoked. she has never used smokeless tobacco. She reports that she does not drink alcohol or use drugs.  Allergies: No Known Allergies  Medications Prior to Admission  Medication Sig Dispense Refill  . Prenatal Vit-Fe Fumarate-FA (PRENATAL MULTIVITAMIN) TABS tablet Take 1 tablet by mouth daily at 12 noon.    . cefUROXime (CEFTIN) 250 MG tablet Take 1 tablet (250 mg total) by mouth 2 (two) times daily with a meal. (Patient not  taking: Reported on 02/17/2017) 13 tablet 0  . NIFEdipine (PROCARDIA) 10 MG capsule Take 1 capsule (10 mg total) by mouth every 6 (six) hours. As needed for contractions- call if contractions are still 4 x per hour after dose (Patient not taking: Reported on 02/17/2017) 10 capsule 0       Blood pressure 104/62, pulse 71, temperature 97.8 F (36.6 C), temperature source Oral, resp. rate 16, height 5\' 3"  (1.6 m), weight 128 lb (58.1 kg), last menstrual period 05/30/2016, SpO2 99 %, unknown if currently breastfeeding. PE: VSSAF        ABD-gravid, appears Small        FHTs-reactive    Lab Results  Component Value Date   WBC 12.4 (H) 02/17/2017   HGB 11.9 (L) 02/17/2017   HCT 35.0 (L) 02/17/2017   MCV 94.1 02/17/2017   PLT 231 02/17/2017   Lab Results  Component Value Date   PREGTESTUR NEGATIVE 01/27/2015   HCG >2,000.0 (H) 09/02/2016       Patient Active Problem List   Diagnosis Date Noted  . Normal labor and delivery 02/17/2017  . Threatened preterm labor, third trimester 12/24/2016  . Normal pregnancy, first 11/12/2014  . SVD (spontaneous vaginal delivery) 11/12/2014  .  Abdominal pain affecting pregnancy, antepartum   . PCB (post coital bleeding)   . Second trimester bleeding   . [redacted] weeks gestation of pregnancy    IMP/ IUP at term        Late PNC        Change in fetal movement and NST         + CT- Txd early in preg Plan/ Admit  [employer]an Base E 02/17/2017, 1:57 PM

## 2017-02-17 NOTE — MAU Note (Addendum)
Has been having irreg ctx's. 4 cm when last checked. No bleeding or leaking. No problems with ctx's.  Baby moving more than ever before at 0620;alarmed pt- that is why she came in.

## 2017-02-17 NOTE — Anesthesia Procedure Notes (Signed)
Epidural Patient location during procedure: OB Start time: 02/17/2017 2:01 PM End time: 02/17/2017 2:05 PM  Staffing Anesthesiologist: Leilani AbleHatchett, Letasha Kershaw, MD Performed: anesthesiologist   Preanesthetic Checklist Completed: patient identified, site marked, surgical consent, pre-op evaluation, timeout performed, IV checked, risks and benefits discussed and monitors and equipment checked  Epidural Patient position: sitting Prep: site prepped and draped and DuraPrep Patient monitoring: continuous pulse ox and blood pressure Approach: midline Location: L3-L4 Injection technique: LOR air  Needle:  Needle type: Tuohy  Needle gauge: 17 G Needle length: 9 cm and 9 Needle insertion depth: 4 cm Catheter type: closed end flexible Catheter size: 19 Gauge Catheter at skin depth: 9 cm Test dose: negative and Other  Assessment Sensory level: T9 Events: blood not aspirated, injection not painful, no injection resistance, negative IV test and no paresthesia  Additional Notes Reason for block:procedure for pain

## 2017-02-18 ENCOUNTER — Other Ambulatory Visit: Payer: Self-pay

## 2017-02-18 LAB — RPR: RPR Ser Ql: NONREACTIVE

## 2017-02-18 MED ORDER — INFLUENZA VAC SPLIT QUAD 0.5 ML IM SUSY
0.5000 mL | PREFILLED_SYRINGE | INTRAMUSCULAR | Status: AC
  Administered 2017-02-18: 0.5 mL via INTRAMUSCULAR
  Filled 2017-02-18: qty 0.5

## 2017-02-18 NOTE — Progress Notes (Signed)
Patient is eating, ambulating, voiding.  Pain control is good.  Appropriate lochia, no complaints.  Vitals:   02/17/17 1834 02/17/17 1930 02/17/17 2328 02/18/17 0637  BP: 114/69 100/62 105/64 102/62  Pulse: (!) 58 67 62 (!) 58  Resp: 19 17 18 18   Temp:  98.9 F (37.2 C) 98.5 F (36.9 C) 97.9 F (36.6 C)  TempSrc:  Oral Oral Oral  SpO2: 100% 100%    Weight:      Height:        Fundus firm Perineum without swelling. No CT  Lab Results  Component Value Date   WBC 12.4 (H) 02/17/2017   HGB 11.9 (L) 02/17/2017   HCT 35.0 (L) 02/17/2017   MCV 94.1 02/17/2017   PLT 231 02/17/2017    --/--/O POS (11/18 1052)  A/P Post partum day 1. Doing well.  Routine care.    Philip AspenALLAHAN, Laquinton Bihm

## 2017-02-18 NOTE — Anesthesia Postprocedure Evaluation (Signed)
Anesthesia Post Note  Patient: Gail Byrd  Procedure(s) Performed: AN AD HOC LABOR EPIDURAL     Patient location during evaluation: Mother Baby Anesthesia Type: Epidural Level of consciousness: awake and alert and oriented Pain management: pain level controlled Vital Signs Assessment: post-procedure vital signs reviewed and stable Respiratory status: spontaneous breathing and nonlabored ventilation Cardiovascular status: stable Postop Assessment: no headache, patient able to bend at knees, no backache, no apparent nausea or vomiting, epidural receding and adequate PO intake Anesthetic complications: no    Last Vitals:  Vitals:   02/17/17 2328 02/18/17 0637  BP: 105/64 102/62  Pulse: 62 (!) 58  Resp: 18 18  Temp: 36.9 C 36.6 C  SpO2:      Last Pain:  Vitals:   02/18/17 0741  TempSrc:   PainSc: 3    Pain Goal:                 Laban EmperorMalinova,Juwuan Sedita Hristova

## 2017-02-19 MED ORDER — IBUPROFEN 600 MG PO TABS: 600.0000 mg | ORAL_TABLET | Freq: Four times a day (QID) | ORAL | 3 refills | Status: DC | PRN

## 2017-02-19 MED ORDER — ACETAMINOPHEN 325 MG PO TABS: 650.0000 mg | ORAL_TABLET | ORAL | 1 refills | Status: AC | PRN

## 2017-02-19 NOTE — Progress Notes (Signed)
Post Partum Day 2 Subjective: no complaints, up ad lib, voiding, tolerating PO and bottle feeding  Objective: Blood pressure 112/70, pulse (!) 52, temperature 98.1 F (36.7 C), temperature source Oral, resp. rate 16, height 5\' 3"  (1.6 m), weight 58.1 kg (128 lb), last menstrual period 05/30/2016, SpO2 100 %, unknown if currently breastfeeding.  Physical Exam:  General: alert, cooperative and no distress Lochia: appropriate Uterine Fundus: firm perineum: healing well, no significant drainage DVT Evaluation: No evidence of DVT seen on physical exam. Negative Homan's sign. No cords or calf tenderness.  Recent Labs    02/17/17 1052  HGB 11.9*  HCT 35.0*    Assessment/Plan: Discharge home   LOS: 2 days   Ronda Rajkumar STACIA 02/19/2017, 9:20 AM

## 2017-02-19 NOTE — Discharge Summary (Signed)
Obstetric Discharge Summary Reason for Admission: induction of labor for NRFHT Prenatal Procedures: NST and ultrasound Intrapartum Procedures: spontaneous vaginal delivery Postpartum Procedures: none Complications-Operative and Postpartum: none Hemoglobin  Date Value Ref Range Status  02/17/2017 11.9 (L) 12.0 - 15.0 g/dL Final   HCT  Date Value Ref Range Status  02/17/2017 35.0 (L) 36.0 - 46.0 % Final    Physical Exam:  General: alert, cooperative and no distress Lochia: appropriate Uterine Fundus: firm perineum: healing well, no significant drainage DVT Evaluation: No evidence of DVT seen on physical exam. Negative Homan's sign. No cords or calf tenderness.    Discharge Diagnoses: Term Pregnancy-delivered  Discharge Information: Date: 02/19/2017 Activity: pelvic rest Diet: routine Medications: PNV, Ibuprofen and tylenol Condition: stable Instructions: refer to practice specific booklet Discharge to: home   Newborn Data: Live born female  Birth Weight: 7 lb 1.1 oz (3205 g) APGAR: 9, 9  Newborn Delivery   Birth date/time:  02/17/2017 16:41:00 Delivery type:  Vaginal, Spontaneous     Home with mother.  Essie HartINN, Chetara Kropp STACIA 02/19/2017, 9:56 AM

## 2017-02-19 NOTE — Plan of Care (Signed)
Discharge instructions given. Mom verbalizes understanding and denies having any questions or concerns. Royston CowperIsley, Zi Sek E, RN

## 2017-02-28 ENCOUNTER — Encounter (HOSPITAL_COMMUNITY): Payer: Self-pay

## 2017-02-28 ENCOUNTER — Emergency Department (HOSPITAL_COMMUNITY)
Admission: EM | Admit: 2017-02-28 | Discharge: 2017-02-28 | Disposition: A | Discharge status: Home / Self Care | Payer: Medicaid Other | Attending: Emergency Medicine | Admitting: Emergency Medicine

## 2017-02-28 ENCOUNTER — Emergency Department (HOSPITAL_COMMUNITY): Payer: Medicaid Other

## 2017-02-28 DIAGNOSIS — N3000 Acute cystitis without hematuria: Secondary | ICD-10-CM | POA: Insufficient documentation

## 2017-02-28 DIAGNOSIS — K5900 Constipation, unspecified: Secondary | ICD-10-CM | POA: Insufficient documentation

## 2017-02-28 DIAGNOSIS — R1084 Generalized abdominal pain: Secondary | ICD-10-CM | POA: Diagnosis present

## 2017-02-28 LAB — I-STAT BETA HCG BLOOD, ED (MC, WL, AP ONLY): HCG, QUANTITATIVE: 357.6 m[IU]/mL — AB (ref <5)

## 2017-02-28 LAB — COMPREHENSIVE METABOLIC PANEL
ALT: 25 U/L (ref 14–54)
AST: 20 U/L (ref 15–41)
Albumin: 3.4 g/dL — ABNORMAL LOW (ref 3.5–5.0)
Alkaline Phosphatase: 143 U/L — ABNORMAL HIGH (ref 38–126)
Anion gap: 7 (ref 5–15)
BUN: 13 mg/dL (ref 6–20)
CO2: 26 mmol/L (ref 22–32)
Calcium: 9.3 mg/dL (ref 8.9–10.3)
Chloride: 107 mmol/L (ref 101–111)
Creatinine, Ser: 0.66 mg/dL (ref 0.44–1.00)
GFR calc Af Amer: >60 mL/min (ref >60)
GFR calc non Af Amer: >60 mL/min (ref >60)
Glucose, Bld: 102 mg/dL — ABNORMAL HIGH (ref 65–99)
Potassium: 4.3 mmol/L (ref 3.5–5.1)
Sodium: 140 mmol/L (ref 135–145)
Total Bilirubin: 0.6 mg/dL (ref 0.3–1.2)
Total Protein: 6.4 g/dL — ABNORMAL LOW (ref 6.5–8.1)

## 2017-02-28 LAB — URINALYSIS, ROUTINE W REFLEX MICROSCOPIC
Bilirubin Urine: NEGATIVE
Glucose, UA: NEGATIVE mg/dL
Ketones, ur: NEGATIVE mg/dL
Nitrite: NEGATIVE
Protein, ur: 100 mg/dL — AB
Specific Gravity, Urine: 1.023 (ref 1.005–1.030)
pH: 8 (ref 5.0–8.0)

## 2017-02-28 LAB — CBC
HCT: 44.8 % (ref 36.0–46.0)
Hemoglobin: 15.1 g/dL — ABNORMAL HIGH (ref 12.0–15.0)
MCH: 31 pg (ref 26.0–34.0)
MCHC: 33.7 g/dL (ref 30.0–36.0)
MCV: 92 fL (ref 78.0–100.0)
Platelets: 370 10*3/uL (ref 150–400)
RBC: 4.87 MIL/uL (ref 3.87–5.11)
RDW: 12.4 % (ref 11.5–15.5)
WBC: 9.8 10*3/uL (ref 4.0–10.5)

## 2017-02-28 LAB — LIPASE, BLOOD: Lipase: 20 U/L (ref 11–51)

## 2017-02-28 MED ORDER — POLYETHYLENE GLYCOL 3350 17 G PO PACK
17.0000 g | PACK | Freq: Every day | ORAL | Status: DC
  Administered 2017-02-28: 17 g via ORAL

## 2017-02-28 MED ORDER — FENTANYL CITRATE (PF) 100 MCG/2ML IJ SOLN
50.0000 ug | INTRAMUSCULAR | Status: DC | PRN
  Administered 2017-02-28: 50 ug via NASAL
  Filled 2017-02-28: qty 2

## 2017-02-28 MED ORDER — POLYETHYLENE GLYCOL 3350 17 G PO PACK: 17.0000 g | PACK | Freq: Every day | ORAL | 0 refills | Status: DC

## 2017-02-28 MED ORDER — ONDANSETRON 4 MG PO TBDP
4.0000 mg | ORAL_TABLET | Freq: Once | ORAL | Status: AC
  Administered 2017-02-28: 4 mg via ORAL
  Filled 2017-02-28: qty 1

## 2017-02-28 MED ORDER — SULFAMETHOXAZOLE-TRIMETHOPRIM 800-160 MG PO TABS: 1.0000 | ORAL_TABLET | Freq: Two times a day (BID) | ORAL | 0 refills | Status: AC

## 2017-02-28 MED ORDER — SULFAMETHOXAZOLE-TRIMETHOPRIM 800-160 MG PO TABS
1.0000 | ORAL_TABLET | Freq: Once | ORAL | Status: AC
  Administered 2017-02-28: 1 via ORAL
  Filled 2017-02-28: qty 1

## 2017-02-28 MED ORDER — SORBITOL 70 % SOLN
960.0000 mL | TOPICAL_OIL | Freq: Once | ORAL | Status: AC
  Administered 2017-02-28: 960 mL via RECTAL
  Filled 2017-02-28: qty 473

## 2017-02-28 NOTE — ED Notes (Signed)
Pt verbalizes understanding of d/c instructions. Pt received prescriptions. Pt ambulatory at d/c with all belongings.  

## 2017-02-28 NOTE — ED Notes (Signed)
Commode placed at bedside. Enema pending waiting for enema kit to arrive from materials. Will continue to monitor pt.

## 2017-02-28 NOTE — ED Provider Notes (Signed)
[facility] Provider Note   CSN: [id number] Arrival date & time: 02/28/17  1319     History   Chief Complaint Chief Complaint  Patient presents with  . Abdominal Pain    HPI Gail Byrd is a 21 y.o. female.  Pt presents to the ED today with abdominal pain.  She had a vaginal delivery of a baby girl on 11/18.  Pt is bottle feeding.  She is no longer taking the PNV.  She has been constipated since then.  Pt did try an otc enema as well as milk of magnesia today without success.  Pt said she has a lot of pressure and some nausea.  No vomiting.      Past Medical History:  Diagnosis Date  . Allergy   . Bacterial vaginosis   . Chlamydia   . Trichomonas infection   . Urinary tract infection   . Vaginal yeast infection     Patient Active Problem List   Diagnosis Date Noted  . Normal labor and delivery 02/17/2017  . Pregnancy 02/17/2017  . Threatened preterm labor, third trimester 12/24/2016  . Normal pregnancy, first 11/12/2014  . SVD (spontaneous vaginal delivery) 11/12/2014  . Abdominal pain affecting pregnancy, antepartum   . PCB (post coital bleeding)   . Second trimester bleeding   . [redacted] weeks gestation of pregnancy     Past Surgical History:  Procedure Laterality Date  . NO PAST SURGERIES      OB History    Gravida Para Term Preterm AB Living   2 2 2     2    SAB TAB Ectopic Multiple Live Births         0 2       Home Medications    Prior to Admission medications   Medication Sig Start Date End Date Taking? Authorizing Provider  acetaminophen (TYLENOL) 325 MG tablet Take 2 tablets (650 mg total) every 4 (four) hours as needed by mouth for moderate pain (for pain scale < 4). 02/19/17  Yes Essie HartPinn, Walda, MD  magnesium hydroxide (MILK OF MAGNESIA) 400 MG/5ML suspension Take 60 mLs by mouth daily as needed for mild constipation.   Yes [provider]  ibuprofen (ADVIL,MOTRIN) 600 MG tablet Take 1 tablet  (600 mg total) every 6 (six) hours as needed by mouth for cramping. Patient not taking: Reported on 02/28/2017 02/19/17   Essie HartPinn, Walda, MD  polyethylene glycol (MIRALAX / GLYCOLAX) packet Take 17 g by mouth daily. 02/28/17   Jacalyn LefevreHaviland, Maddock Finigan, MD  sulfamethoxazole-trimethoprim (BACTRIM DS,SEPTRA DS) 800-160 MG tablet Take 1 tablet by mouth 2 (two) times daily for 7 days. 02/28/17 03/07/17  Jacalyn LefevreHaviland, Sohan Potvin, MD    Family History Family History  Problem Relation Age of Onset  . Diabetes Paternal Grandmother   . Depression Paternal Grandmother   . Drug abuse Paternal Grandmother   . Early death Paternal Grandmother   . Heart disease Paternal Grandmother   . Hypertension Paternal Grandmother   . Birth defects Mother   . Depression Mother   . Learning disabilities Mother   . Learning disabilities Father   . Learning disabilities Sister   . Learning disabilities Brother   . Alcohol abuse Maternal Grandmother   . Depression Maternal Grandmother   . Mental illness Maternal Grandmother   . Arthritis Neg Hx   . Asthma Neg Hx   . Cancer Neg Hx   . COPD Neg Hx   . Hearing loss Neg Hx   .  Hyperlipidemia Neg Hx   . Kidney disease Neg Hx   . Mental retardation Neg Hx   . Miscarriages / Stillbirths Neg Hx   . Stroke Neg Hx   . Vision loss Neg Hx   . Varicose Veins Neg Hx     Social History Social History   Tobacco Use  . Smoking status: Never Smoker  . Smokeless tobacco: Never Used  Substance Use Topics  . Alcohol use: No  . Drug use: No     Allergies   Patient has no known allergies.   Review of Systems Review of Systems  Gastrointestinal: Positive for abdominal pain, constipation and nausea.  All other systems reviewed and are negative.    Physical Exam Updated Vital Signs BP 111/81   Pulse 86   Temp 98.3 F (36.8 C) (Oral)   Resp 16   Ht 5\' 3"  (1.6 m)   Wt 58.1 kg (128 lb)   LMP 05/30/2016 (Approximate)   SpO2 100%   BMI 22.67 kg/m   Physical Exam    Constitutional: She is oriented to person, place, and time. She appears well-developed and well-nourished.  HENT:  Head: Normocephalic and atraumatic.  Mouth/Throat: Oropharynx is clear and moist.  Eyes: EOM are normal. Pupils are equal, round, and reactive to light.  Cardiovascular: Normal rate, regular rhythm, normal heart sounds and intact distal pulses.  Pulmonary/Chest: Effort normal and breath sounds normal.  Abdominal: Bowel sounds are decreased. There is generalized tenderness.  Neurological: She is alert and oriented to person, place, and time.  Skin: Skin is warm and dry. Capillary refill takes less than 2 seconds.  Psychiatric: She has a normal mood and affect. Her behavior is normal.  Nursing note and vitals reviewed.    ED Treatments / Results  Labs (all labs ordered are listed, but only abnormal results are displayed) Labs Reviewed  COMPREHENSIVE METABOLIC PANEL - Abnormal; Notable for the following components:      Result Value   Glucose, Bld 102 (*)    Total Protein 6.4 (*)    Albumin 3.4 (*)    Alkaline Phosphatase 143 (*)    All other components within normal limits  CBC - Abnormal; Notable for the following components:   Hemoglobin 15.1 (*)    All other components within normal limits  URINALYSIS, ROUTINE W REFLEX MICROSCOPIC - Abnormal; Notable for the following components:   Color, Urine AMBER (*)    APPearance CLOUDY (*)    Hgb urine dipstick LARGE (*)    Protein, ur 100 (*)    Leukocytes, UA LARGE (*)    Bacteria, UA FEW (*)    Squamous Epithelial / LPF 6-30 (*)    All other components within normal limits  I-STAT BETA HCG BLOOD, ED ([facility], [facility], [facility] ONLY) - Abnormal; Notable for the following components:   I-stat hCG, quantitative 357.6 (*)    All other components within normal limits  LIPASE, BLOOD    EKG  EKG Interpretation None       Radiology Dg Abdomen Acute W/chest  Result Date: 02/28/2017 CLINICAL DATA:  21 year old female with  generalized abdominal pain, increasing over the last week. EXAM: DG ABDOMEN ACUTE W/ 1V CHEST COMPARISON:  None. FINDINGS: There is a moderate stool burden, particularly in the descending colon. There is no evidence of dilated bowel loops or free intraperitoneal air. No radiopaque calculi or other significant radiographic abnormality is seen. Heart size and mediastinal contours are within normal limits. Both lungs are clear. IMPRESSION:  Moderate stool burden.  No acute cardiopulmonary disease. Electronically Signed   By: Sande Brothers M.D.   On: 02/28/2017 19:33    Procedures Procedures (including critical care time)  Medications Ordered in ED Medications  fentaNYL (SUBLIMAZE) injection 50 mcg (50 mcg Nasal Given 02/28/17 1331)  polyethylene glycol (MIRALAX / GLYCOLAX) packet 17 g (17 g Oral Given 02/28/17 1954)  sulfamethoxazole-trimethoprim (BACTRIM DS,SEPTRA DS) 800-160 MG per tablet 1 tablet (not administered)  sorbitol, milk of mag, mineral oil, glycerin (SMOG) enema (960 mLs Rectal Given 02/28/17 2154)  ondansetron (ZOFRAN-ODT) disintegrating tablet 4 mg (4 mg Oral Given 02/28/17 1954)     Initial Impression / Assessment and Plan / ED Course  I have reviewed the triage vital signs and the nursing notes.  Pertinent labs & imaging results that were available during my care of the patient were reviewed by me and considered in my medical decision making (see chart for details).    Enema successful.  Pt had a large bm.  Pt also has an uti, so will be started on bactrim.  She knows to return if worse.  Final Clinical Impressions(s) / ED Diagnoses   Final diagnoses:  Constipation, unspecified constipation type  Acute cystitis without hematuria    ED Discharge Orders        Ordered    polyethylene glycol (MIRALAX / GLYCOLAX) packet  Daily     02/28/17 2225    sulfamethoxazole-trimethoprim (BACTRIM DS,SEPTRA DS) 800-160 MG tablet  2 times daily     02/28/17 2225       Jacalyn Lefevre, MD 03/04/17 [id number]

## 2017-02-28 NOTE — ED Triage Notes (Signed)
Per Pt, Pt is coming from home with complaints of generalized abdominal pain. Reports recently giving birth on 11/18. Pain started one week ago and has increasingly gotten worse. Denies any heavy bleeding or blood in urine. Some constipation noted with enema not successful. Nausea, no vomiting.

## 2018-05-01 IMAGING — US US MFM OB COMP +14 WKS
1 series · 14 of 28 positions shown · non-contrast
Comparison: none

[Series 1: us mfm ob comp +14 wks · 107 acquisitions, 14 frames shown]
[im 4/107]
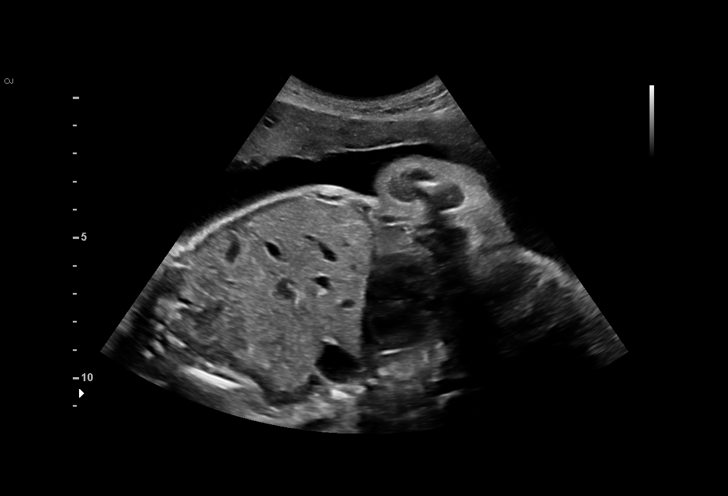
[im 12/107]
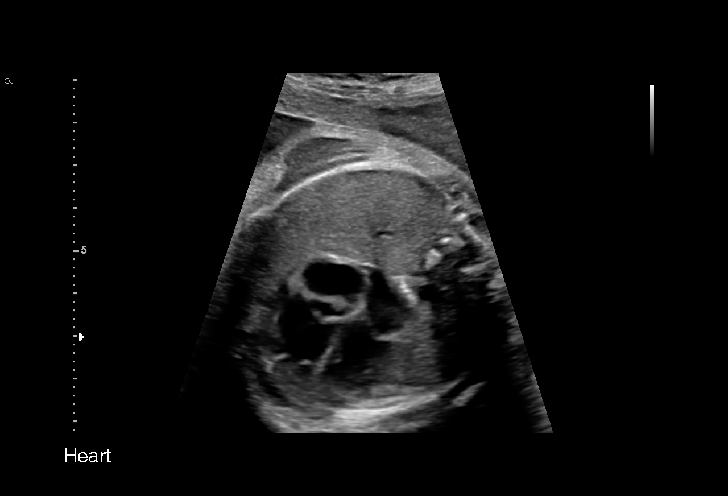
[im 20/107]
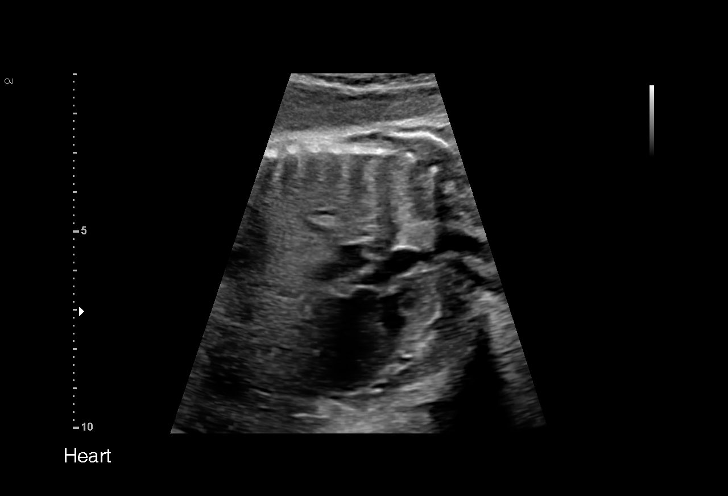
[im 28/107]
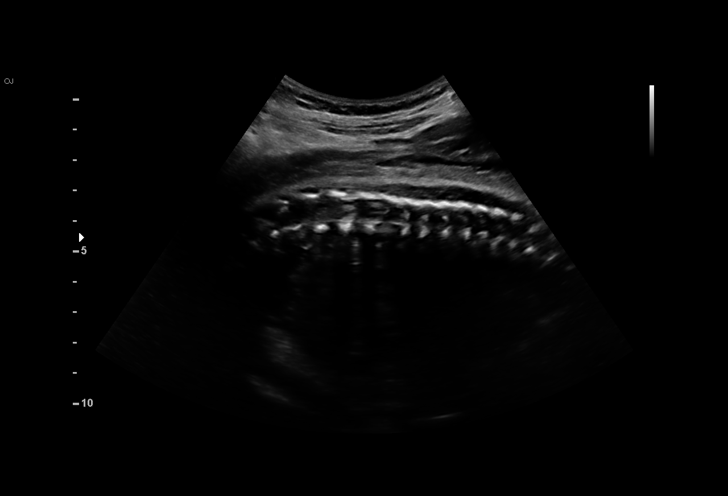
[im 36/107]
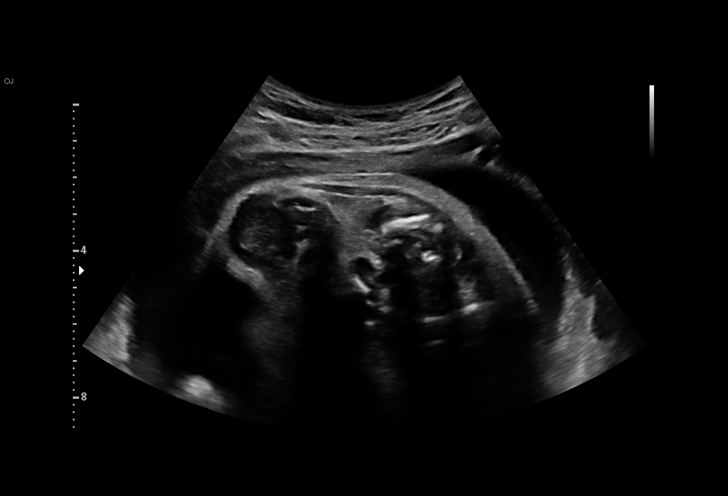
[im 44/107]
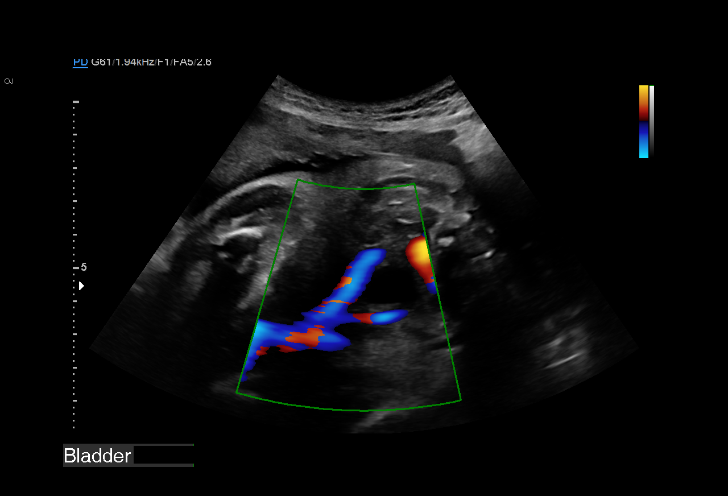
[im 52/107]
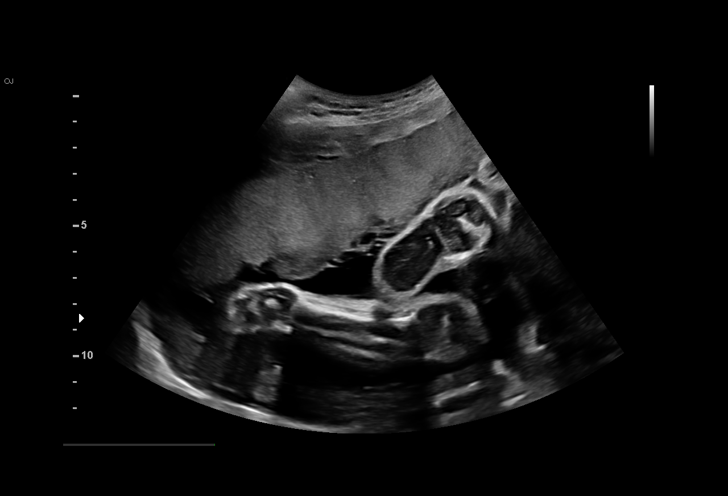
[im 59/107]
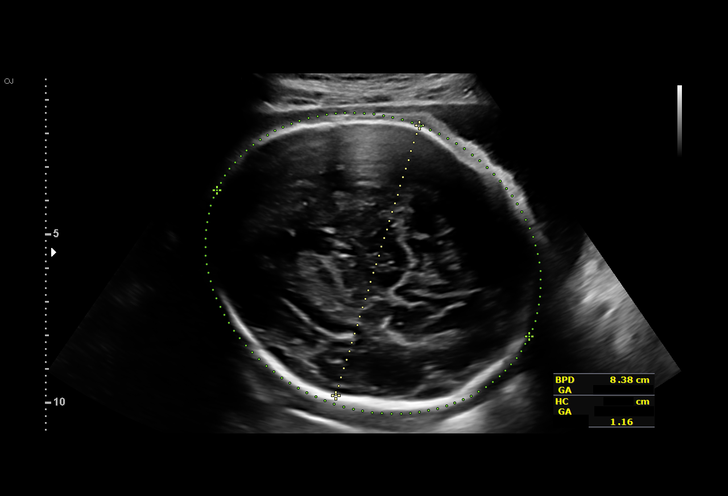
[im 67/107]
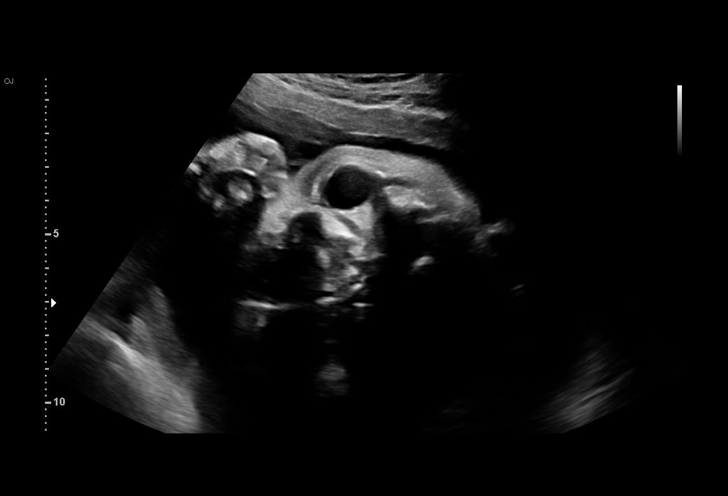
[im 75/107]
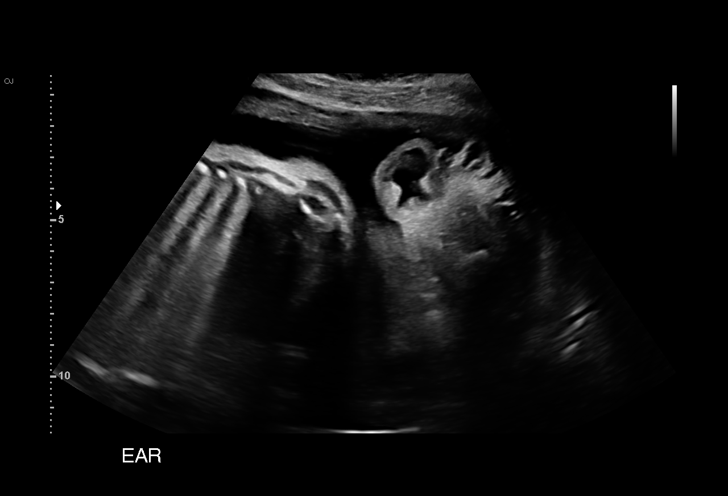
[im 83/107]
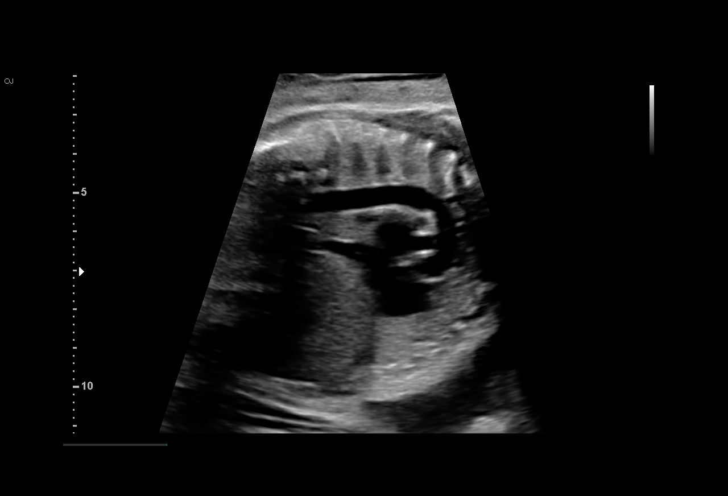
[im 91/107]
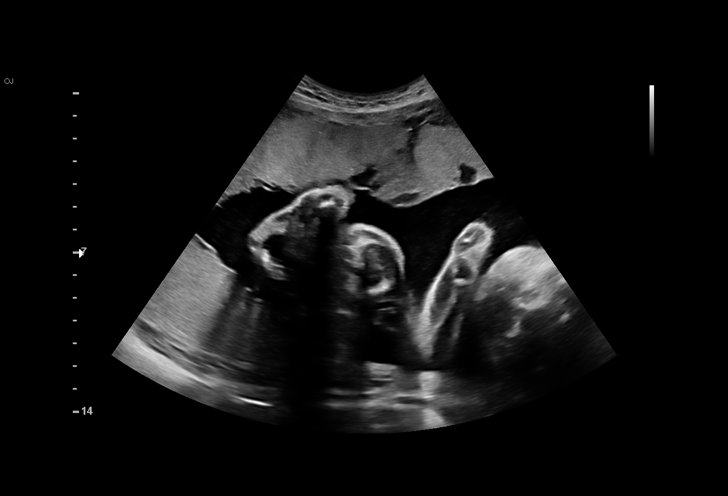
[im 99/107]
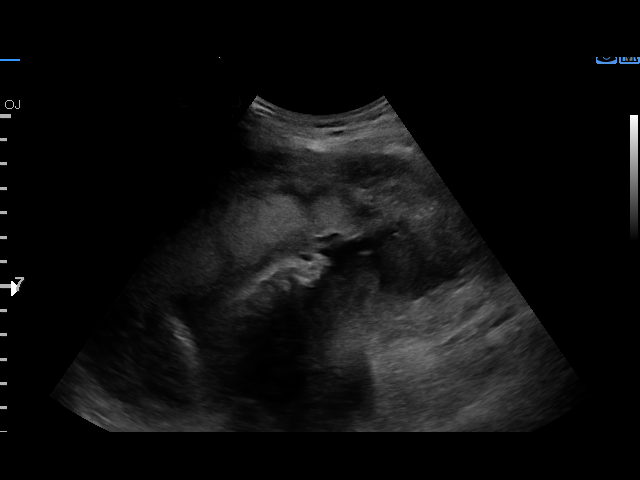
[im 107/107]
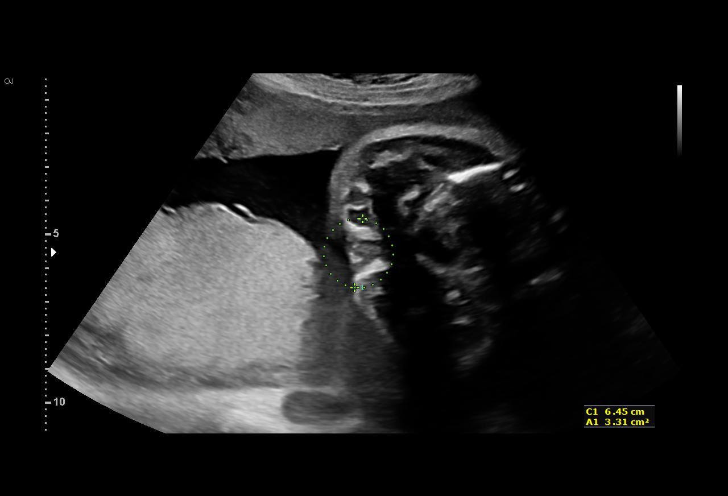

[14 of 28 positions shown; findings below may reference images not displayed]

Indications

30 weeks gestation of pregnancy
Encounter for antenatal screening for
malformations
Preterm labor without delivery, third trimesterIJC.CV
Polyhydramnios, third trimester, antepartum
condition or complication, unspecified fetus
OB History

Blood Type:            Height:  5'3"   Weight (lb):  126      BMI:
Gravidity:    2         Term:   1
Living:       1
Fetal Evaluation

Num Of Fetuses:     1
Fetal Heart         135
Rate(bpm):
Cardiac Activity:   Observed
Presentation:       Cephalic
Placenta:           Fundal, above cervical os
P. Cord Insertion:  Visualized

Amniotic Fluid
AFI FV:      Subjectively within normal limits

AFI Sum(cm)     %Tile       Largest Pocket(cm)
20.29           78

RUQ(cm)       RLQ(cm)       LUQ(cm)        LLQ(cm)
4.56
Biometry

BPD:      83.1  mm     G. Age:  33w 3d         96  %    CI:        78.91   %   70 - 86
FL/HC:      19.0   %   19.3 -
HC:      295.8  mm     G. Age:  32w 5d         65  %    HC/AC:      1.12       0.96 -
AC:      264.5  mm     G. Age:  30w 4d         38  %    FL/BPD:     67.7   %   71 - 87
FL:       56.3  mm     G. Age:  29w 4d          9  %    FL/AC:      21.3   %   20 - 24

Est. FW:    6760  gm      3 lb 9 oz     51  %
Gestational Age

LMP:           29w 5d       Date:   05/30/16                 EDD:   03/06/17
U/S Today:     31w 4d                                        EDD:   02/21/17
Best:          30w 6d    Det. By:   Early Ultrasound         EDD:   02/26/17
Anatomy

Cranium:               Appears normal         Aortic Arch:            Appears normal
Cavum:                 Appears normal         Ductal Arch:            Appears normal
Ventricles:            Appears normal         Diaphragm:              Appears normal
Choroid Plexus:        Appears normal         Stomach:                Appears normal, left
sided
Cerebellum:            Appears normal         Abdomen:                Appears normal
Posterior Fossa:       Appears normal         Abdominal Wall:         Not well visualized
Nuchal Fold:           Not applicable (>20    Cord Vessels:           Appears normal (3
wks GA)                                        vessel cord)
Face:                  Appears normal         Kidneys:                Appear normal
(orbits and profile)
Lips:                  Appears normal         Bladder:                Appears normal
Thoracic:              Appears normal         Spine:                  Appears normal
Heart:                 Appears normal         Upper Extremities:      Visualized
(4CH, axis, and situs
RVOT:                  Appears normal         Lower Extremities:      Visualized
LVOT:                  Appears normal

Other:  Female gender. Complete fetal anatomic survey previously performed
in office. Technically difficult due to advanced GA and fetal position.
Cervix Uterus Adnexa

Cervix
Not visualized (advanced GA >81wks)
Impression

Singleton intrauterine pregnancy at 30+6 weeks
Review of the anatomy shows no sonographic markers for
aneuploidy or structural anomalies
However, evaluations should be considered suboptimal
secondary to late EGA and fetal position
Amniotic fluid volume is normal with an AFI of 20 cm
Estimated fetal weight is 1619g which is growth in the 51st
percentile
Recommendations

No evidence of polyhydramnios or structural anomaly
Follow-up ultrasounds as clinically indicated.

## 2021-02-06 ENCOUNTER — Emergency Department (HOSPITAL_COMMUNITY)
Admission: EM | Admit: 2021-02-06 | Discharge: 2021-02-06 | Disposition: A | Discharge status: Home / Self Care | Payer: Medicaid Other | Attending: Emergency Medicine | Admitting: Emergency Medicine

## 2021-02-06 ENCOUNTER — Other Ambulatory Visit: Payer: Self-pay

## 2021-02-06 ENCOUNTER — Encounter (HOSPITAL_COMMUNITY): Payer: Self-pay | Admitting: Emergency Medicine

## 2021-02-06 DIAGNOSIS — N3001 Acute cystitis with hematuria: Secondary | ICD-10-CM | POA: Insufficient documentation

## 2021-02-06 DIAGNOSIS — R1030 Lower abdominal pain, unspecified: Secondary | ICD-10-CM | POA: Diagnosis present

## 2021-02-06 LAB — URINALYSIS, ROUTINE W REFLEX MICROSCOPIC
Bilirubin Urine: NEGATIVE
Glucose, UA: NEGATIVE mg/dL
Ketones, ur: NEGATIVE mg/dL
Nitrite: NEGATIVE
Protein, ur: 100 mg/dL — AB
RBC / HPF: >50 RBC/hpf — ABNORMAL HIGH (ref 0–5)
Specific Gravity, Urine: 1.024 (ref 1.005–1.030)
WBC, UA: >50 WBC/hpf — ABNORMAL HIGH (ref 0–5)
pH: 5 (ref 5.0–8.0)

## 2021-02-06 LAB — PREGNANCY, URINE: Preg Test, Ur: NEGATIVE

## 2021-02-06 MED ORDER — PHENAZOPYRIDINE HCL 200 MG PO TABS: 200.0000 mg | ORAL_TABLET | Freq: Three times a day (TID) | ORAL | 0 refills | Status: DC

## 2021-02-06 MED ORDER — IBUPROFEN 600 MG PO TABS: 600.0000 mg | ORAL_TABLET | Freq: Four times a day (QID) | ORAL | 0 refills | Status: AC | PRN

## 2021-02-06 MED ORDER — CEPHALEXIN 500 MG PO CAPS: 500.0000 mg | ORAL_CAPSULE | Freq: Four times a day (QID) | ORAL | 0 refills | Status: DC

## 2021-02-06 NOTE — ED Provider Notes (Signed)
MSE was initiated and I personally evaluated the patient and placed orders (if any) at  1:44 AM on February 06, 2021.  Patient to ED with lower abdominal pain for the past 2-3 days. No fever, flank pain, vaginal discharge or vomiting. She reports pain with urination and hematuria.   Today's Vitals   02/06/21 0143 02/06/21 0144  BP: 113/80   Pulse: (!) 58   Resp: 16   Temp: 97.9 F (36.6 C)   SpO2: 99%   PainSc:  8    There is no height or weight on file to calculate BMI.  Lower abdominal tenderness Soft abdomen Nontoxic appearing patient  The patient appears stable so that the remainder of the MSE may be completed by another provider.   Elpidio Anis, PA-C 02/06/21 0145    Glynn Octave, MD 02/06/21 [phone]

## 2021-02-06 NOTE — ED Provider Notes (Signed)
[facility] EMERGENCY DEPARTMENT Provider Note   CSN: [id number] Arrival date & time: 02/06/21  0038     History Chief Complaint  Patient presents with   Abdominal Pain    Hematuria/dysuria    SUNDAY KLOS is a 25 y.o. female.  Patient to ED with lower abdominal pain for the past 2-3 days. No fever, flank pain, vaginal discharge or vomiting. She reports pain with urination and hematuria. History of UTI.   The history is provided by the patient. No language interpreter was used.  Abdominal Pain Associated symptoms: dysuria and hematuria   Associated symptoms: no fever       Past Medical History:  Diagnosis Date   Allergy    Bacterial vaginosis    Chlamydia    Trichomonas infection    Urinary tract infection    Vaginal yeast infection     Patient Active Problem List   Diagnosis Date Noted   Normal labor and delivery 02/17/2017   Pregnancy 02/17/2017   Threatened preterm labor, third trimester 12/24/2016   Normal pregnancy, first 11/12/2014   SVD (spontaneous vaginal delivery) 11/12/2014   Abdominal pain affecting pregnancy, antepartum    PCB (post coital bleeding)    Second trimester bleeding    [redacted] weeks gestation of pregnancy     Past Surgical History:  Procedure Laterality Date   NO PAST SURGERIES       OB History     Gravida  2   Para  2   Term  2   Preterm      AB      Living  2      SAB      IAB      Ectopic      Multiple  0   Live Births  2           Family History  Problem Relation Age of Onset   Diabetes Paternal Grandmother    Depression Paternal Grandmother    Drug abuse Paternal Grandmother    Early death Paternal Grandmother    Heart disease Paternal Grandmother    Hypertension Paternal Grandmother    Birth defects Mother    Depression Mother    Learning disabilities Mother    Learning disabilities Father    Learning disabilities Sister    Learning disabilities Brother    Alcohol abuse  Maternal Grandmother    Depression Maternal Grandmother    Mental illness Maternal Grandmother    Arthritis Neg Hx    Asthma Neg Hx    Cancer Neg Hx    COPD Neg Hx    Hearing loss Neg Hx    Hyperlipidemia Neg Hx    Kidney disease Neg Hx    Mental retardation Neg Hx    Miscarriages / Stillbirths Neg Hx    Stroke Neg Hx    Vision loss Neg Hx    Varicose Veins Neg Hx     Social History   Tobacco Use   Smoking status: Never   Smokeless tobacco: Never  Vaping Use   Vaping Use: Never used  Substance Use Topics   Alcohol use: No   Drug use: No    Home Medications Prior to Admission medications   Medication Sig Start Date End Date Taking? Authorizing Provider  acetaminophen (TYLENOL) 325 MG tablet Take 2 tablets (650 mg total) every 4 (four) hours as needed by mouth for moderate pain (for pain scale < 4). 02/19/17   Essie Hart, MD  ibuprofen (ADVIL,MOTRIN) 600 MG tablet Take 1 tablet (600 mg total) every 6 (six) hours as needed by mouth for cramping. Patient not taking: Reported on 02/28/2017 02/19/17   Sanjuana Kava, MD  magnesium hydroxide (MILK OF MAGNESIA) 400 MG/5ML suspension Take 60 mLs by mouth daily as needed for mild constipation.    [provider]  polyethylene glycol (MIRALAX / GLYCOLAX) packet Take 17 g by mouth daily. 02/28/17   Isla Pence, MD    Allergies    Patient has no known allergies.  Review of Systems   Review of Systems  Constitutional:  Negative for fever.  Gastrointestinal:  Positive for abdominal pain.  Genitourinary:  Positive for dysuria and hematuria. Negative for flank pain.   Physical Exam Updated Vital Signs BP 113/80 (BP Location: Left Arm)   Pulse (!) 58   Temp 97.9 F (36.6 C)   Resp 16   Ht 5\' 3"  (1.6 m)   Wt 75 kg   LMP 01/23/2021   SpO2 99%   BMI 29.29 kg/m   Physical Exam Constitutional:      General: She is not in acute distress.    Appearance: She is well-developed. She is not ill-appearing or  toxic-appearing.  Pulmonary:     Effort: Pulmonary effort is normal.  Abdominal:     Palpations: Abdomen is soft.     Comments: Suprapubic abdominal tenderness.   Musculoskeletal:        General: Normal range of motion.     Cervical back: Normal range of motion.  Skin:    General: Skin is warm and dry.  Neurological:     Mental Status: She is alert and oriented to person, place, and time.    ED Results / Procedures / Treatments   Labs (all labs ordered are listed, but only abnormal results are displayed) Labs Reviewed  URINALYSIS, ROUTINE W REFLEX MICROSCOPIC - Abnormal; Notable for the following components:      Result Value   APPearance CLOUDY (*)    Hgb urine dipstick LARGE (*)    Protein, ur 100 (*)    Leukocytes,Ua LARGE (*)    RBC / HPF >50 (*)    WBC, UA >50 (*)    Bacteria, UA FEW (*)    All other components within normal limits  PREGNANCY, URINE    EKG None  Radiology No results found.  Procedures Procedures   Medications Ordered in ED Medications - No data to display  ED Course  I have reviewed the triage vital signs and the nursing notes.  Pertinent labs & imaging results that were available during my care of the patient were reviewed by me and considered in my medical decision making (see chart for details).    MDM Rules/Calculators/A&P                           Patient to ED with symptoms c/w UTI. UA c/w infection. She denies vaginal discharge, fever, vomiting.   Will treat with Keflex, pyridium, ibuprofen. Return precautions discussed.  Final Clinical Impression(s) / ED Diagnoses Final diagnoses:  None   Acute cystitis  Rx / DC Orders ED Discharge Orders     None        Charlann Lange, PA-C 02/06/21 0402    Ezequiel Essex, MD 02/06/21 [phone]

## 2021-02-06 NOTE — ED Triage Notes (Signed)
Patient reports hypogastric pain with hematuria , dysuria and nausea onset last week .

## 2021-02-06 NOTE — Discharge Instructions (Signed)
Take the medications as prescribed. If you develop worsening symptoms of pain, vomiting, fever, return to the ED for further evaluation.

## 2021-02-22 ENCOUNTER — Other Ambulatory Visit: Payer: Self-pay

## 2021-02-22 ENCOUNTER — Encounter: Payer: Self-pay | Admitting: Emergency Medicine

## 2021-02-22 ENCOUNTER — Ambulatory Visit
Admission: EM | Admit: 2021-02-22 | Discharge: 2021-02-22 | Disposition: A | Discharge status: Home / Self Care | Payer: Medicaid Other | Attending: Internal Medicine | Admitting: Internal Medicine

## 2021-02-22 DIAGNOSIS — J101 Influenza due to other identified influenza virus with other respiratory manifestations: Secondary | ICD-10-CM | POA: Insufficient documentation

## 2021-02-22 DIAGNOSIS — R053 Chronic cough: Secondary | ICD-10-CM | POA: Insufficient documentation

## 2021-02-22 DIAGNOSIS — J029 Acute pharyngitis, unspecified: Secondary | ICD-10-CM | POA: Insufficient documentation

## 2021-02-22 LAB — POCT INFLUENZA A/B
Influenza A, POC: POSITIVE — AB
Influenza B, POC: NEGATIVE

## 2021-02-22 LAB — POCT RAPID STREP A (OFFICE): Rapid Strep A Screen: NEGATIVE

## 2021-02-22 MED ORDER — PREDNISONE 20 MG PO TABS: 40.0000 mg | ORAL_TABLET | Freq: Every day | ORAL | 0 refills | Status: AC

## 2021-02-22 MED ORDER — BENZONATATE 100 MG PO CAPS: 100.0000 mg | ORAL_CAPSULE | Freq: Three times a day (TID) | ORAL | 0 refills | Status: AC | PRN

## 2021-02-22 NOTE — ED Triage Notes (Addendum)
Sore throat and cough x 3 weeks, unsure of fever. Reports diarrhea and body aches

## 2021-02-22 NOTE — Discharge Instructions (Signed)
Your flu test was positive.  Strep test was negative.  Throat culture is pending.  We will call if it is positive.  You have been prescribed medications help alleviate symptoms.

## 2021-02-22 NOTE — ED Provider Notes (Signed)
[facility]    CSN: [id number] Arrival date & time: 02/22/21  1102      History   Chief Complaint Chief Complaint  Patient presents with   Sore Throat   Cough    HPI Gail Byrd is a 25 y.o. female.   Patient presents with sore throat, cough, body aches, and diarrhea.  Sore throat and cough have been present for 3 weeks.  Cough is nonproductive per patient.  Patient not sure if she has had a fever at home.  Body aches and diarrhea started approximately 3 days ago.  Children have similar symptoms at home.  Denies nausea, vomiting, abdominal pain, chest pain, shortness of breath.  She has not yet seen any other healthcare provider since cough and sore throat started.   Sore Throat  Cough  Past Medical History:  Diagnosis Date   Allergy    Bacterial vaginosis    Chlamydia    Trichomonas infection    Urinary tract infection    Vaginal yeast infection     Patient Active Problem List   Diagnosis Date Noted   Normal labor and delivery 02/17/2017   Pregnancy 02/17/2017   Threatened preterm labor, third trimester 12/24/2016   Normal pregnancy, first 11/12/2014   SVD (spontaneous vaginal delivery) 11/12/2014   Abdominal pain affecting pregnancy, antepartum    PCB (post coital bleeding)    Second trimester bleeding    [redacted] weeks gestation of pregnancy     Past Surgical History:  Procedure Laterality Date   NO PAST SURGERIES      OB History     Gravida  2   Para  2   Term  2   Preterm      AB      Living  2      SAB      IAB      Ectopic      Multiple  0   Live Births  2            Home Medications    Prior to Admission medications   Medication Sig Start Date End Date Taking? Authorizing Provider  benzonatate (TESSALON) 100 MG capsule Take 1 capsule (100 mg total) by mouth every 8 (eight) hours as needed for cough. 02/22/21  Yes Maxximus Gotay, Rolly Salter E, FNP  predniSONE (DELTASONE) 20 MG tablet Take 2 tablets (40 mg total) by  mouth daily for 5 days. 02/22/21 02/27/21 Yes Breton Berns, Acie Fredrickson, FNP  acetaminophen (TYLENOL) 325 MG tablet Take 2 tablets (650 mg total) every 4 (four) hours as needed by mouth for moderate pain (for pain scale < 4). 02/19/17   Essie Hart, MD  cephALEXin (KEFLEX) 500 MG capsule Take 1 capsule (500 mg total) by mouth 4 (four) times daily. 02/06/21   Elpidio Anis, PA-C  ibuprofen (ADVIL) 600 MG tablet Take 1 tablet (600 mg total) by mouth every 6 (six) hours as needed for cramping. 02/06/21   Elpidio Anis, PA-C  magnesium hydroxide (MILK OF MAGNESIA) 400 MG/5ML suspension Take 60 mLs by mouth daily as needed for mild constipation.    [provider]  phenazopyridine (PYRIDIUM) 200 MG tablet Take 1 tablet (200 mg total) by mouth 3 (three) times daily. 02/06/21   Elpidio Anis, PA-C  polyethylene glycol (MIRALAX / GLYCOLAX) packet Take 17 g by mouth daily. 02/28/17   Jacalyn Lefevre, MD    Family History Family History  Problem Relation Age of Onset   Diabetes Paternal Grandmother  Depression Paternal Grandmother    Drug abuse Paternal Grandmother    Early death Paternal Grandmother    Heart disease Paternal Grandmother    Hypertension Paternal Grandmother    Birth defects Mother    Depression Mother    Learning disabilities Mother    Learning disabilities Father    Learning disabilities Sister    Learning disabilities Brother    Alcohol abuse Maternal Grandmother    Depression Maternal Grandmother    Mental illness Maternal Grandmother    Arthritis Neg Hx    Asthma Neg Hx    Cancer Neg Hx    COPD Neg Hx    Hearing loss Neg Hx    Hyperlipidemia Neg Hx    Kidney disease Neg Hx    Mental retardation Neg Hx    Miscarriages / Stillbirths Neg Hx    Stroke Neg Hx    Vision loss Neg Hx    Varicose Veins Neg Hx     Social History Social History   Tobacco Use   Smoking status: Never   Smokeless tobacco: Never  Vaping Use   Vaping Use: Never used  Substance Use Topics    Alcohol use: No   Drug use: No     Allergies   Patient has no known allergies.   Review of Systems Review of Systems Per HPI  Physical Exam Triage Vital Signs ED Triage Vitals  Enc Vitals Group     BP 02/22/21 1303 112/60     Pulse Rate 02/22/21 1303 (!) 112     Resp 02/22/21 1303 16     Temp 02/22/21 1303 99.2 F (37.3 C)     Temp Source 02/22/21 1303 Oral     SpO2 02/22/21 1303 98 %     Weight --      Height --      Head Circumference --      Peak Flow --      Pain Score 02/22/21 1354 2     Pain Loc --      Pain Edu? --      Excl. in GC? --    No data found.  Updated Vital Signs BP 112/60 (BP Location: Right Arm)   Pulse (!) 112   Temp 99.2 F (37.3 C) (Oral)   Resp 16   LMP 01/23/2021   SpO2 98%   Visual Acuity Right Eye Distance:   Left Eye Distance:   Bilateral Distance:    Right Eye Near:   Left Eye Near:    Bilateral Near:     Physical Exam Constitutional:      General: She is not in acute distress.    Appearance: Normal appearance. She is not toxic-appearing or diaphoretic.  HENT:     Head: Normocephalic and atraumatic.     Right Ear: Tympanic membrane and ear canal normal.     Left Ear: Tympanic membrane and ear canal normal.     Nose: Congestion present.     Mouth/Throat:     Mouth: Mucous membranes are moist.     Pharynx: Posterior oropharyngeal erythema present.  Eyes:     Extraocular Movements: Extraocular movements intact.     Conjunctiva/sclera: Conjunctivae normal.     Pupils: Pupils are equal, round, and reactive to light.  Cardiovascular:     Rate and Rhythm: Normal rate and regular rhythm.     Pulses: Normal pulses.     Heart sounds: Normal heart sounds.  Pulmonary:     Effort: Pulmonary  effort is normal. No respiratory distress.     Breath sounds: Normal breath sounds. No stridor. No wheezing, rhonchi or rales.  Abdominal:     General: Abdomen is flat. Bowel sounds are normal.     Palpations: Abdomen is soft.   Musculoskeletal:        General: Normal range of motion.     Cervical back: Normal range of motion.  Skin:    General: Skin is warm and dry.  Neurological:     General: No focal deficit present.     Mental Status: She is alert and oriented to person, place, and time. Mental status is at baseline.  Psychiatric:        Mood and Affect: Mood normal.        Behavior: Behavior normal.     UC Treatments / Results  Labs (all labs ordered are listed, but only abnormal results are displayed) Labs Reviewed  POCT INFLUENZA A/B - Abnormal; Notable for the following components:      Result Value   Influenza A, POC Positive (*)    All other components within normal limits  CULTURE, GROUP A STREP [facility])  POCT RAPID STREP A (OFFICE)    EKG   Radiology No results found.  Procedures Procedures (including critical care time)  Medications Ordered in UC Medications - No data to display  Initial Impression / Assessment and Plan / UC Course  I have reviewed the triage vital signs and the nursing notes.  Pertinent labs & imaging results that were available during my care of the patient were reviewed by me and considered in my medical decision making (see chart for details).     Patient tested positive for influenza A.  It is difficult to determine when influenza started given patient's duration of sore throat and cough.  Therefore, Tamiflu may not be effective at this time.  Discussed supportive care and symptom management with patient.  suggested chest x-ray given duration of cough, but the patient declined with shared decision making.  No adventitious lung sounds and no shortness of breath so do think that deferring chest imaging is reasonable.  Rapid strep test was negative.  Throat culture is pending.  Prescribed prednisone and cough medication for symptoms.  No red flags on exam.  Discussed return precautions.  Patient verbalized understanding and was agreeable with plan. Final Clinical  Impressions(s) / UC Diagnoses   Final diagnoses:  Influenza A  Persistent cough for 3 weeks or longer  Sore throat     Discharge Instructions      Your flu test was positive.  Strep test was negative.  Throat culture is pending.  We will call if it is positive.  You have been prescribed medications help alleviate symptoms.    ED Prescriptions     Medication Sig Dispense Auth. Provider   predniSONE (DELTASONE) 20 MG tablet Take 2 tablets (40 mg total) by mouth daily for 5 days. 10 tablet [place], [place], [place]   benzonatate (TESSALON) 100 MG capsule Take 1 capsule (100 mg total) by mouth every 8 (eight) hours as needed for cough. 21 capsule [place], Acie Fredrickson, [place]      PDMP not reviewed this encounter.   Gustavus Bryant, [place] 02/22/21 1358

## 2021-02-25 LAB — CULTURE, GROUP A STREP (THRC)

## 2021-09-07 ENCOUNTER — Ambulatory Visit
Admission: EM | Admit: 2021-09-07 | Discharge: 2021-09-07 | Disposition: A | Discharge status: Home / Self Care | Payer: Medicaid Other | Attending: Internal Medicine | Admitting: Internal Medicine

## 2021-09-07 DIAGNOSIS — N3001 Acute cystitis with hematuria: Secondary | ICD-10-CM | POA: Insufficient documentation

## 2021-09-07 LAB — POCT URINALYSIS DIP (MANUAL ENTRY)
Bilirubin, UA: NEGATIVE
Glucose, UA: NEGATIVE mg/dL
Ketones, POC UA: NEGATIVE mg/dL
Nitrite, UA: POSITIVE — AB
Protein Ur, POC: >=300 mg/dL — AB
Spec Grav, UA: 1.015 (ref 1.010–1.025)
Urobilinogen, UA: 0.2 E.U./dL
pH, UA: 6 (ref 5.0–8.0)

## 2021-09-07 LAB — POCT URINE PREGNANCY: Preg Test, Ur: NEGATIVE

## 2021-09-07 MED ORDER — CEFTRIAXONE SODIUM 1 G IJ SOLR
1.0000 g | Freq: Once | INTRAMUSCULAR | Status: AC
  Administered 2021-09-07: 1 g via INTRAMUSCULAR

## 2021-09-07 MED ORDER — CIPROFLOXACIN HCL 500 MG PO TABS: 500.0000 mg | ORAL_TABLET | Freq: Two times a day (BID) | ORAL | 0 refills | Status: DC

## 2021-09-07 NOTE — Discharge Instructions (Signed)
You have a urinary tract infection which is being treated with 2 antibiotics.  You were given an injection in urgent care today and were sent antibiotics to the pharmacy.  Please do not do any strenuous physical activity including exercise while taking this antibiotic.  Urine culture is pending. we will call if there are any abnormalities.

## 2021-09-07 NOTE — ED Triage Notes (Addendum)
Patient presents to Urgent Care with complaints of lower back pain, dysuria and fever since tuesday. Patient reports tylenol otc as needed.  Constipation, taking stool softer last bm 2 days ago

## 2021-09-07 NOTE — ED Provider Notes (Signed)
[facility]    CSN: [id number] Arrival date & time: 09/07/21  1745      History   Chief Complaint Chief Complaint  Patient presents with   Back Pain   Fever    HPI Gail Byrd is a 26 y.o. female.   Patient presents with left lower back pain, urinary burning, fever that has been present for approximately 3 days.  Patient denies urinary frequency, urinary urgency, abdominal pain vaginal discharge, hematuria.  Tmax at home was 101 and last temperature was 2 days ago.  She is also concerned for constipation as her last bowel movement was 2 days ago.  Patient reports that she was having constipation prior to 2 days ago and took MiraLAX over-the-counter with subsequent bowel movement.  Denies any recent blood in stool.  Denies nausea or vomiting.  Patient reports that she has had intermittent " stomach issues" her entire life and has not ever been evaluated by healthcare provider.  She reports that she has always had "stomach pain before bowel movement".  Patient is still passing flatulence.   Back Pain Fever   Past Medical History:  Diagnosis Date   Allergy    Bacterial vaginosis    Chlamydia    Trichomonas infection    Urinary tract infection    Vaginal yeast infection     Patient Active Problem List   Diagnosis Date Noted   Normal labor and delivery 02/17/2017   Pregnancy 02/17/2017   Threatened preterm labor, third trimester 12/24/2016   Normal pregnancy, first 11/12/2014   SVD (spontaneous vaginal delivery) 11/12/2014   Abdominal pain affecting pregnancy, antepartum    PCB (post coital bleeding)    Second trimester bleeding    [redacted] weeks gestation of pregnancy     Past Surgical History:  Procedure Laterality Date   NO PAST SURGERIES      OB History     Gravida  2   Para  2   Term  2   Preterm      AB      Living  2      SAB      IAB      Ectopic      Multiple  0   Live Births  2            Home Medications     Prior to Admission medications   Medication Sig Start Date End Date Taking? Authorizing Provider  ciprofloxacin (CIPRO) 500 MG tablet Take 1 tablet (500 mg total) by mouth every 12 (twelve) hours. 09/07/21  Yes Laurin Morgenstern, Acie FredricksonHaley E, FNP  acetaminophen (TYLENOL) 325 MG tablet Take 2 tablets (650 mg total) every 4 (four) hours as needed by mouth for moderate pain (for pain scale < 4). Patient not taking: Reported on 09/07/2021 02/19/17   Essie HartPinn, Walda, MD  benzonatate (TESSALON) 100 MG capsule Take 1 capsule (100 mg total) by mouth every 8 (eight) hours as needed for cough. Patient not taking: Reported on 09/07/2021 02/22/21   Gustavus BryantMound, Dayshawn Irizarry E, FNP  cephALEXin (KEFLEX) 500 MG capsule Take 1 capsule (500 mg total) by mouth 4 (four) times daily. Patient not taking: Reported on 09/07/2021 02/06/21   Elpidio AnisUpstill, Shari, PA-C  ibuprofen (ADVIL) 600 MG tablet Take 1 tablet (600 mg total) by mouth every 6 (six) hours as needed for cramping. Patient not taking: Reported on 09/07/2021 02/06/21   Elpidio AnisUpstill, Shari, PA-C  magnesium hydroxide (MILK OF MAGNESIA) 400 MG/5ML suspension Take 60 mLs by mouth  daily as needed for mild constipation. Patient not taking: Reported on 09/07/2021    [provider]  phenazopyridine (PYRIDIUM) 200 MG tablet Take 1 tablet (200 mg total) by mouth 3 (three) times daily. Patient not taking: Reported on 09/07/2021 02/06/21   Elpidio Anis, PA-C  polyethylene glycol (MIRALAX / GLYCOLAX) packet Take 17 g by mouth daily. Patient not taking: Reported on 09/07/2021 02/28/17   Jacalyn Lefevre, MD    Family History Family History  Problem Relation Age of Onset   Diabetes Paternal Grandmother    Depression Paternal Grandmother    Drug abuse Paternal Grandmother    Early death Paternal Grandmother    Heart disease Paternal Grandmother    Hypertension Paternal Grandmother    Birth defects Mother    Depression Mother    Learning disabilities Mother    Learning disabilities Father    Learning  disabilities Sister    Learning disabilities Brother    Alcohol abuse Maternal Grandmother    Depression Maternal Grandmother    Mental illness Maternal Grandmother    Arthritis Neg Hx    Asthma Neg Hx    Cancer Neg Hx    COPD Neg Hx    Hearing loss Neg Hx    Hyperlipidemia Neg Hx    Kidney disease Neg Hx    Mental retardation Neg Hx    Miscarriages / Stillbirths Neg Hx    Stroke Neg Hx    Vision loss Neg Hx    Varicose Veins Neg Hx     Social History Social History   Tobacco Use   Smoking status: Never   Smokeless tobacco: Never  Vaping Use   Vaping Use: Never used  Substance Use Topics   Alcohol use: No   Drug use: No     Allergies   Patient has no known allergies.   Review of Systems Review of Systems Per HPI  Physical Exam Triage Vital Signs ED Triage Vitals  Enc Vitals Group     BP 09/07/21 1820 121/81     Pulse Rate 09/07/21 1820 91     Resp 09/07/21 1820 18     Temp 09/07/21 1820 98.1 F (36.7 C)     Temp Source 09/07/21 1820 Oral     SpO2 09/07/21 1820 98 %     Weight --      Height --      Head Circumference --      Peak Flow --      Pain Score 09/07/21 1818 5     Pain Loc --      Pain Edu? --      Excl. in GC? --    No data found.  Updated Vital Signs BP 121/81   Pulse 91   Temp 98.1 F (36.7 C) (Oral)   Resp 18   LMP 08/24/2021 (Approximate)   SpO2 98%   Visual Acuity Right Eye Distance:   Left Eye Distance:   Bilateral Distance:    Right Eye Near:   Left Eye Near:    Bilateral Near:     Physical Exam Constitutional:      General: She is not in acute distress.    Appearance: Normal appearance. She is not toxic-appearing or diaphoretic.  HENT:     Head: Normocephalic and atraumatic.  Eyes:     Extraocular Movements: Extraocular movements intact.     Conjunctiva/sclera: Conjunctivae normal.  Cardiovascular:     Rate and Rhythm: Normal rate and regular rhythm.  Pulses: Normal pulses.     Heart sounds: Normal heart  sounds.  Pulmonary:     Effort: Pulmonary effort is normal. No respiratory distress.     Breath sounds: Normal breath sounds.  Abdominal:     General: Abdomen is flat. Bowel sounds are normal. There is no distension.     Palpations: Abdomen is soft.     Tenderness: There is no abdominal tenderness.  Neurological:     General: No focal deficit present.     Mental Status: She is alert and oriented to person, place, and time. Mental status is at baseline.  Psychiatric:        Mood and Affect: Mood normal.        Behavior: Behavior normal.        Thought Content: Thought content normal.        Judgment: Judgment normal.      UC Treatments / Results  Labs (all labs ordered are listed, but only abnormal results are displayed) Labs Reviewed  POCT URINALYSIS DIP (MANUAL ENTRY) - Abnormal; Notable for the following components:      Result Value   Clarity, UA cloudy (*)    Blood, UA large (*)    Protein Ur, POC >=300 (*)    Nitrite, UA Positive (*)    Leukocytes, UA Moderate (2+) (*)    All other components within normal limits  URINE CULTURE  POCT URINALYSIS DIP (MANUAL ENTRY)  POCT URINE PREGNANCY    EKG   Radiology No results found.  Procedures Procedures (including critical care time)  Medications Ordered in UC Medications  cefTRIAXone (ROCEPHIN) injection 1 g (has no administration in time range)    Initial Impression / Assessment and Plan / UC Course  I have reviewed the triage vital signs and the nursing notes.  Pertinent labs & imaging results that were available during my care of the patient were reviewed by me and considered in my medical decision making (see chart for details).     Urinalysis indicating urinary tract infection.  I am highly suspicious of possible pyelonephritis given patient's fever and back pain.  Will treat with IM Rocephin and Cipro sent over to pharmacy.  Advised patient to not do any strenuous physical activity while on Cipro  antibiotic.  Urine culture is pending.  Patient having intermittent constipation.  Advised patient to continue MiraLAX as needed and discussed medications that she can take over-the-counter to supplement with MiraLAX to prevent constipation.Discussed high fiber diet and supportive care as well. Patient is still passing flatulence so no concern for bowel obstruction.  Patient is not having any abdominal pain so do not think that emergent evaluation the hospital or imaging is necessary at this time.  Although, patient was given strict return and ER precautions if symptoms do not improve or if they worsen regarding all chief complaints.  Patient may need to see specialist or PCP if abdominal issues to continue.  Patient verbalized understanding and was agreeable with plan. Final Clinical Impressions(s) / UC Diagnoses   Final diagnoses:  Acute cystitis with hematuria     Discharge Instructions      You have a urinary tract infection which is being treated with 2 antibiotics.  You were given an injection in urgent care today and were sent antibiotics to the pharmacy.  Please do not do any strenuous physical activity including exercise while taking this antibiotic.  Urine culture is pending. we will call if there are any abnormalities.   ED  Prescriptions     Medication Sig Dispense Auth. Provider   ciprofloxacin (CIPRO) 500 MG tablet Take 1 tablet (500 mg total) by mouth every 12 (twelve) hours. 10 tablet Gustavus Bryant, [place]      PDMP not reviewed this encounter.   Gustavus Bryant, [place] 09/07/21 1905

## 2021-09-10 LAB — URINE CULTURE: Culture: >=100000 — AB

## 2023-12-09 ENCOUNTER — Encounter: Payer: Self-pay | Admitting: Family Medicine

## 2023-12-09 ENCOUNTER — Ambulatory Visit: Admitting: Family Medicine

## 2023-12-09 VITALS — BP 120/84 | HR 79 | Temp 98.7°F | Ht 63.0 in | Wt 146.0 lb

## 2023-12-09 DIAGNOSIS — R110 Nausea: Secondary | ICD-10-CM | POA: Diagnosis not present

## 2023-12-09 DIAGNOSIS — L503 Dermatographic urticaria: Secondary | ICD-10-CM

## 2023-12-09 DIAGNOSIS — R1084 Generalized abdominal pain: Secondary | ICD-10-CM | POA: Diagnosis not present

## 2023-12-09 DIAGNOSIS — K2100 Gastro-esophageal reflux disease with esophagitis, without bleeding: Secondary | ICD-10-CM | POA: Diagnosis not present

## 2023-12-09 DIAGNOSIS — R140 Abdominal distension (gaseous): Secondary | ICD-10-CM

## 2023-12-09 DIAGNOSIS — L509 Urticaria, unspecified: Secondary | ICD-10-CM

## 2023-12-09 DIAGNOSIS — Z6825 Body mass index (BMI) 25.0-25.9, adult: Secondary | ICD-10-CM

## 2023-12-09 LAB — CBC WITH DIFFERENTIAL/PLATELET
Basophils Absolute: 0 K/uL (ref 0.0–0.1)
Basophils Relative: 1 % (ref 0.0–3.0)
Eosinophils Absolute: 0 K/uL (ref 0.0–0.7)
Eosinophils Relative: 1.1 % (ref 0.0–5.0)
HCT: 41.3 % (ref 36.0–46.0)
Hemoglobin: 13.7 g/dL (ref 12.0–15.0)
Lymphocytes Relative: 50.5 % — ABNORMAL HIGH (ref 12.0–46.0)
Lymphs Abs: 2.1 K/uL (ref 0.7–4.0)
MCHC: 33.1 g/dL (ref 30.0–36.0)
MCV: 89.8 fl (ref 78.0–100.0)
Monocytes Absolute: 0.3 K/uL (ref 0.1–1.0)
Monocytes Relative: 6.8 % (ref 3.0–12.0)
Neutro Abs: 1.7 K/uL (ref 1.4–7.7)
Neutrophils Relative %: 40.6 % — ABNORMAL LOW (ref 43.0–77.0)
Platelets: 231 K/uL (ref 150.0–400.0)
RBC: 4.6 Mil/uL (ref 3.87–5.11)
RDW: 12.9 % (ref 11.5–15.5)
WBC: 4.1 K/uL (ref 4.0–10.5)

## 2023-12-09 LAB — COMPREHENSIVE METABOLIC PANEL WITH GFR
ALT: 18 U/L (ref 0–35)
AST: 14 U/L (ref 0–37)
Albumin: 4.1 g/dL (ref 3.5–5.2)
Alkaline Phosphatase: 47 U/L (ref 39–117)
BUN: 12 mg/dL (ref 6–23)
CO2: 22 meq/L (ref 19–32)
Calcium: 9.1 mg/dL (ref 8.4–10.5)
Chloride: 107 meq/L (ref 96–112)
Creatinine, Ser: 0.86 mg/dL (ref 0.40–1.20)
GFR: 92.83 mL/min (ref >60.00)
Glucose, Bld: 81 mg/dL (ref 70–99)
Potassium: 3.9 meq/L (ref 3.5–5.1)
Sodium: 139 meq/L (ref 135–145)
Total Bilirubin: 0.3 mg/dL (ref 0.2–1.2)
Total Protein: 6.8 g/dL (ref 6.0–8.3)

## 2023-12-09 MED ORDER — FAMOTIDINE 20 MG PO TABS: 20.0000 mg | ORAL_TABLET | Freq: Two times a day (BID) | ORAL | 3 refills | Status: AC

## 2023-12-09 NOTE — Patient Instructions (Addendum)
 Welcome to [facility]!  Thank you for choosing us  for your Primary Care needs.   We offer in person and video appointments for your convenience. You may call our office to schedule appointments, or you may schedule appointments with me through MyChart.   The best way to get in contact with me is via MyChart message. This will get to me faster than a phone call, unless there is an emergency, then please call 911.  The lab is located downstairs in the Sports Medicine building, we also have xray available there.   May try magnesium  glycinate tablets at night to help with constipation.   May take Pepcid  twice a day for 2 weeks, then once daily afterward.  Try a low FODMAP diet.  We are checking labs today, will be in contact with any results that require further attention.

## 2023-12-09 NOTE — Progress Notes (Unsigned)
 New Patient Visit  Subjective:     Patient ID: Gail Byrd, female    DOB: 08-20-1995, 28 y.o.   MRN: [id number]  Chief Complaint  Patient presents with   Establish Care    Ongoing stomach/GI concerns.    HPI  Discussed the use of AI scribe software for clinical note transcription with the patient, who gave verbal consent to proceed.  History of Present Illness Gail Byrd is a 28 year old female who presents with chronic gastrointestinal issues and skin sensitivity.  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   benzonatate  (TESSALON ) 100 MG capsule Take 1 capsule (100 mg total) by mouth every 8 (eight) hours as needed for cough.   cephALEXin  (KEFLEX ) 500 MG capsule Take 1 capsule (500 mg total) by mouth 4 (four) times daily.   ciprofloxacin  (CIPRO ) 500 MG tablet Take 1 tablet (500 mg  total) by mouth every 12 (twelve) hours.   famotidine  (PEPCID ) 20 MG tablet Take 1 tablet (20 mg total) by mouth 2 (two) times daily.   ibuprofen  (ADVIL ) 600 MG tablet Take 1 tablet (600 mg total) by mouth every 6 (six) hours as needed for cramping.   magnesium  hydroxide (MILK OF MAGNESIA) 400 MG/5ML suspension Take 60 mLs by mouth daily as needed for mild constipation.   phenazopyridine  (PYRIDIUM ) 200 MG tablet Take 1 tablet (200 mg total) by mouth 3 (three) times daily.   polyethylene glycol (MIRALAX  / GLYCOLAX ) packet Take 17 g by mouth daily.   VENTOLIN  HFA 108 (90 Base) MCG/ACT inhaler Inhale 2 puffs into the lungs every 4 (four) hours as needed for wheezing or shortness of breath.   No facility-administered encounter medications on file as of 12/09/2023.    Past Medical History:  Diagnosis Date   Allergy    Bacterial vaginosis    Chlamydia    Trichomonas infection    Urinary tract infection    Vaginal yeast infection     Past Surgical History:  Procedure Laterality Date   NO PAST SURGERIES      Family History  Problem Relation Age of Onset   Diabetes Paternal Grandmother    Depression Paternal Grandmother    Drug abuse Paternal Grandmother    Early death Paternal Grandmother    Heart disease Paternal Grandmother    Hypertension Paternal Grandmother    Birth defects Mother    Depression Mother    Learning disabilities Mother    Learning disabilities Father    Learning disabilities Sister    Learning  disabilities Brother    Alcohol abuse Maternal Grandmother    Depression Maternal Grandmother    Mental illness Maternal Grandmother    Arthritis Neg Hx    Asthma Neg Hx    Cancer Neg Hx    COPD Neg Hx    Hearing loss Neg Hx    Hyperlipidemia Neg Hx    Kidney disease Neg Hx    Mental retardation Neg Hx    Miscarriages / Stillbirths Neg Hx    Stroke Neg Hx    Vision loss Neg Hx    Varicose Veins Neg Hx     Social History   Socioeconomic History   Marital  status: Single    Spouse name: Not on file   Number of children: Not on file   Years of education: Not on file   Highest education level: Not on file  Occupational History   Not on file  Tobacco Use   Smoking status: Never   Smokeless tobacco: Never  Vaping Use   Vaping status: Never Used  Substance and Sexual Activity   Alcohol use: No   Drug use: No   Sexual activity: Yes    Birth control/protection: None  Other Topics Concern   Not on file  Social History Narrative   Not on file   Social Drivers of Health   Financial Resource Strain: Not on file  Food Insecurity: Not on file  Transportation Needs: Not on file  Physical Activity: Not on file  Stress: Not on file  Social Connections: Not on file  Intimate Partner Violence: Not on file       Objective:    BP 120/84 (BP Location: Left Arm, Patient Position: Sitting)   Pulse 79   Temp 98.7 F (37.1 C) (Temporal)   Ht 5' 3 (1.6 m)   Wt 146 lb (66.2 kg)   LMP 12/01/2023 (Approximate)   SpO2 100%   Breastfeeding No   BMI 25.86 kg/m    Physical Exam Vitals and nursing note reviewed.  Constitutional:      General: She is not in acute distress.    Appearance: Normal appearance. She is normal weight.  HENT:     Head: Normocephalic and atraumatic.     Right Ear: External ear normal.     Left Ear: External ear normal.     Nose: Nose normal.     Mouth/Throat:     Mouth: Mucous membranes are moist.     Pharynx: Oropharynx is clear.  Eyes:     Extraocular Movements: Extraocular movements intact.     Pupils: Pupils are equal, round, and reactive to light.  Cardiovascular:     Rate and Rhythm: Normal rate and regular rhythm.     Pulses: Normal pulses.     Heart sounds: Normal heart sounds.  Pulmonary:     Effort: Pulmonary effort is normal. No respiratory distress.     Breath sounds: Normal breath sounds. No wheezing, rhonchi or rales.  Musculoskeletal:        General: Normal range of motion.     Cervical  back: Normal range of motion.     Right lower leg: No edema.     Left lower leg: No edema.  Lymphadenopathy:     Cervical: No cervical adenopathy.  Neurological:     General: No focal deficit present.     Mental Status: She is alert and oriented to person, place, and time.  Psychiatric:        Mood and  Affect: Mood normal.        Thought Content: Thought content normal.     No results found for any visits on 12/09/23.      Assessment & Plan:   Assessment and Plan Assessment & Plan Gastroesophageal reflux disease with esophagitis, chronic constipation, abdominal bloating, and chronic nausea Chronic gastrointestinal symptoms for five years, including acid reflux, bloating, and constipation. Possible dietary triggers and histamine intolerance considered. Symptoms significantly impact daily life. - Prescribed Pepcid  twice daily for two weeks, then once daily. - Recommended magnesium  glycinate tablets at bedtime for constipation. - Provided dietary guidance on low FODMAP diet to reduce histamine intake. - Ordered laboratory tests to investigate underlying causes. - Scheduled follow-up in one month to assess progress.  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 Previous treatments caused drowsiness. - Prescribed Pepcid  as a histamine blocker to manage skin sensitivity without drowsiness.     Orders Placed This Encounter  Procedures   CBC w/Diff   Comp Met (CMET)   Celiac Panel   Cyclic citrul peptide antibody, IgG ([facility])     Meds ordered this encounter  Medications   famotidine  (PEPCID ) 20 MG tablet    Sig: Take 1 tablet (20 mg total) by mouth 2 (two) times daily.    Dispense:  60 tablet    Refill:  3    Return in about 4 weeks (around 01/06/2024) for re eval GERD.  Gail LITTIE Ku, FNP

## 2023-12-10 LAB — CYCLIC CITRUL PEPTIDE ANTIBODY, IGG: Cyclic Citrullin Peptide Ab: <16 U

## 2023-12-11 LAB — GLIA (IGA/G) + TTG IGA
Antigliadin Abs, IgA: 6 U (ref 0–19)
Gliadin IgG: 4 U (ref 0–19)
Transglutaminase IgA: <2 U/mL (ref 0–3)

## 2023-12-12 ENCOUNTER — Ambulatory Visit: Payer: Self-pay | Admitting: Family Medicine

## 2023-12-15 DIAGNOSIS — L503 Dermatographic urticaria: Secondary | ICD-10-CM | POA: Insufficient documentation

## 2023-12-15 DIAGNOSIS — L509 Urticaria, unspecified: Secondary | ICD-10-CM | POA: Insufficient documentation

## 2024-01-09 ENCOUNTER — Encounter: Payer: Self-pay | Admitting: Family Medicine

## 2024-01-09 ENCOUNTER — Ambulatory Visit: Admitting: Family Medicine

## 2024-01-09 VITALS — BP 130/84 | HR 73 | Temp 98.7°F | Ht 63.0 in | Wt 137.8 lb

## 2024-01-09 DIAGNOSIS — R0602 Shortness of breath: Secondary | ICD-10-CM

## 2024-01-09 DIAGNOSIS — R140 Abdominal distension (gaseous): Secondary | ICD-10-CM | POA: Diagnosis not present

## 2024-01-09 DIAGNOSIS — R5383 Other fatigue: Secondary | ICD-10-CM

## 2024-01-09 DIAGNOSIS — R110 Nausea: Secondary | ICD-10-CM

## 2024-01-09 DIAGNOSIS — R102 Pelvic and perineal pain unspecified side: Secondary | ICD-10-CM | POA: Diagnosis not present

## 2024-01-09 DIAGNOSIS — M5431 Sciatica, right side: Secondary | ICD-10-CM | POA: Diagnosis not present

## 2024-01-09 DIAGNOSIS — R0789 Other chest pain: Secondary | ICD-10-CM

## 2024-01-09 DIAGNOSIS — R42 Dizziness and giddiness: Secondary | ICD-10-CM | POA: Diagnosis not present

## 2024-01-09 DIAGNOSIS — G479 Sleep disorder, unspecified: Secondary | ICD-10-CM

## 2024-01-09 DIAGNOSIS — M5432 Sciatica, left side: Secondary | ICD-10-CM

## 2024-01-09 DIAGNOSIS — K2100 Gastro-esophageal reflux disease with esophagitis, without bleeding: Secondary | ICD-10-CM | POA: Diagnosis not present

## 2024-01-09 DIAGNOSIS — N946 Dysmenorrhea, unspecified: Secondary | ICD-10-CM

## 2024-01-09 MED ORDER — METHOCARBAMOL 500 MG PO TABS: 500.0000 mg | ORAL_TABLET | Freq: Three times a day (TID) | ORAL | 0 refills | Status: AC | PRN

## 2024-01-09 NOTE — Progress Notes (Signed)
 Established Patient Office Visit  Subjective:     Patient ID: Gail Byrd, female    DOB: 1995-10-09, 28 y.o.   MRN: [id number]  No chief complaint on file.   HPI  Discussed the use of AI scribe software for clinical note transcription with the patient, who gave verbal consent to proceed.  History of Present Illness Gail Byrd is a 28 year old female who presents with persistent sciatica and gastrointestinal symptoms.  Sciatica and neuropathic pain - Persistent pain consistent with sciatica, occasionally subsiding for a few days - Pain disrupts sleep, particularly at night - Temporary relief with Excedrin and positional changes - No use of muscle relaxers  Gastrointestinal symptoms - Excessive gas with frequent burping and flatulence, persisting hours after eating or drinking - Nausea affecting appetite and eating habits - Mixed response to dietary changes and famotidine  - Variable severity of acid reflux  Menstrual and pelvic symptoms - Worsening menstrual cycles over the past year with increased pain - Symptoms resembling urinary tract infections without evidence of infection - Painful urination prior to menstrual cycle - Pelvic pain and worsened period symptoms - New onset of pain during or after intercourse - Previous ultrasounds without identified abnormalities  Thermoregulatory symptoms - Feels cold more often than others, with shivering when others are comfortable  Cardiorespiratory symptoms - Shortness of breath and heavy chest sensation occurred randomly over the past three weeks, now resolved - Frequent dizziness and lightheadedness, sometimes while sitting or changing positions - Recent increase in blood pressure  General malaise - Generalized feeling of being unwell impacting daily life and family interactions     ROS Per HPI      Objective:    BP 130/84 (BP Location: Left Arm, Patient Position: Sitting, Cuff Size: Normal)   Pulse  73   Temp 98.7 F (37.1 C) (Temporal)   Ht 5' 3 (1.6 m)   Wt 137 lb 12.8 oz (62.5 kg)   SpO2 97%   BMI 24.41 kg/m    Physical Exam Vitals and nursing note reviewed.  Constitutional:      General: She is not in acute distress.    Appearance: Normal appearance. She is normal weight.  HENT:     Head: Normocephalic and atraumatic.     Right Ear: External ear normal.     Left Ear: External ear normal.     Nose: Nose normal.     Mouth/Throat:     Mouth: Mucous membranes are moist.     Pharynx: Oropharynx is clear.  Eyes:     Extraocular Movements: Extraocular movements intact.     Pupils: Pupils are equal, round, and reactive to light.  Cardiovascular:     Rate and Rhythm: Normal rate and regular rhythm.     Pulses: Normal pulses.     Heart sounds: Normal heart sounds.  Pulmonary:     Effort: Pulmonary effort is normal. No respiratory distress.     Breath sounds: Normal breath sounds. No wheezing, rhonchi or rales.  Musculoskeletal:        General: Normal range of motion.     Cervical back: Normal range of motion.     Right lower leg: No edema.     Left lower leg: No edema.  Lymphadenopathy:     Cervical: No cervical adenopathy.  Neurological:     General: No focal deficit present.     Mental Status: She is alert and oriented to person, place, and time.  Psychiatric:  Mood and Affect: Mood normal.        Thought Content: Thought content normal.     Results for orders placed or performed in visit on 01/09/24  TSH  Result Value Ref Range   TSH 1.93 0.35 - 5.50 uIU/mL  C-reactive protein  Result Value Ref Range   CRP <0.5 0.5 - 20.0 mg/dL  Sedimentation rate  Result Value Ref Range   Sed Rate 6 0 - 20 mm/hr  ANA,IFA RA Diag Pnl w/rflx Tit/Patn  Result Value Ref Range   Anti Nuclear Antibody (ANA) NEGATIVE NEGATIVE   Rheumatoid fact SerPl-aCnc <10 <14 IU/mL   Cyclic Citrullin Peptide Ab <83 UNITS   INTERPRETATION         Assessment & Plan:    Assessment and Plan Assessment & Plan GERD, abdominal bloating, nausea Persistent symptoms may relate to dietary habits. Differential includes small intestinal bacterial overgrowth and H. pylori infection. - Refer to gastroenterology for evaluation. - Order H. pylori breath test. - Encourage dietary modifications and symptom journaling.  Pelvic pain and dysmenorrhea Worsening pelvic pain and dysmenorrhea. Differential includes interstitial cystitis, endometriosis, adenomyosis, and pelvic floor dysfunction. - Provide information on interstitial cystitis and pelvic floor dysfunction. - Consider urology referral. - Encourage pelvic floor exercises on YouTube at home  Bilateral Sciatica Chronic sciatica with intermittent pain affecting daily activities and sleep. - Prescribe muscle relaxer for evening use as needed. - Recommend physical therapy exercises on YouTube.  Shortness of breath and chest heaviness Intermittent symptoms over three weeks. Previous episodes linked to anxiety. Inhaler ineffective.  Chronic fatigue and dizziness, sleep disturbance Chronic fatigue and dizziness with lightheadedness on position changes. Possible thyroid dysfunction or autoimmune condition. - Order thyroid function tests and inflammatory markers. - Encourage symptom journaling for triggers.     Orders Placed This Encounter  Procedures   TSH   C-reactive protein   Sedimentation rate   ANA,IFA RA Diag Pnl w/rflx Tit/Patn   Rheumatoid Factor   Cyclic citrul peptide antibody, IgG (QUEST)   H. pylori breath test   Ambulatory referral to Gastroenterology    Referral Priority:   Routine    Referral Type:   Consultation    Referral Reason:   Specialty Services Required    Number of Visits Requested:   1   EKG 12-Lead     Meds ordered this encounter  Medications   methocarbamol (ROBAXIN) 500 MG tablet    Sig: Take 1 tablet (500 mg total) by mouth every 8 (eight) hours as needed for muscle  spasms.    Dispense:  30 tablet    Refill:  0    Return if symptoms worsen or fail to improve.  Corean LITTIE Ku, FNP

## 2024-01-09 NOTE — Patient Instructions (Addendum)
 I have attached information for sciatica rehab for your back.   Check out pelvic floor PT exercises on youtube.   Gail Byrd and Gail Byrd on Youtube for sciatica PT  I have sent in a muscle relaxer for you to use one tablet as needed for muscle spasms. This medication can make you sleepy. Do not drive until you know how this medication affects you.  Keep a symptom journal to see if we can correlate symptoms to any causes.   We are checking labs today, will be in contact with any results that require further attention.  I have sent in a referral to GI and they will be in contact to get you scheduled.   Follow-up with me for new or worsening symptoms.

## 2024-01-10 ENCOUNTER — Ambulatory Visit: Payer: Self-pay | Admitting: Family Medicine

## 2024-01-10 ENCOUNTER — Other Ambulatory Visit: Payer: Self-pay | Admitting: Family Medicine

## 2024-01-10 LAB — SEDIMENTATION RATE: Sed Rate: 6 mm/h (ref 0–20)

## 2024-01-10 LAB — C-REACTIVE PROTEIN: CRP: <0.5 mg/dL (ref 0.5–20.0)

## 2024-01-10 LAB — ANA,IFA RA DIAG PNL W/RFLX TIT/PATN
Anti Nuclear Antibody (ANA): NEGATIVE
Cyclic Citrullin Peptide Ab: <16 U
Rheumatoid fact SerPl-aCnc: <10 [IU]/mL (ref <14)

## 2024-01-10 LAB — TSH: TSH: 1.93 u[IU]/mL (ref 0.35–5.50)

## 2024-01-11 DIAGNOSIS — N946 Dysmenorrhea, unspecified: Secondary | ICD-10-CM | POA: Insufficient documentation

## 2024-01-11 DIAGNOSIS — R102 Pelvic and perineal pain unspecified side: Secondary | ICD-10-CM | POA: Insufficient documentation

## 2024-01-11 DIAGNOSIS — M5431 Sciatica, right side: Secondary | ICD-10-CM | POA: Insufficient documentation

## 2024-01-14 LAB — H. PYLORI BREATH TEST: H. pylori Breath Test: NOT DETECTED

## 2024-01-16 ENCOUNTER — Ambulatory Visit: Payer: Self-pay | Admitting: Family Medicine

## 2024-02-14 ENCOUNTER — Other Ambulatory Visit: Payer: Self-pay

## 2024-02-14 DIAGNOSIS — N6311 Unspecified lump in the right breast, upper outer quadrant: Secondary | ICD-10-CM

## 2024-03-09 ENCOUNTER — Ambulatory Visit: Admission: RE | Admit: 2024-03-09 | Discharge: 2024-03-09 | Disposition: A | Source: Ambulatory Visit

## 2024-03-09 DIAGNOSIS — N6311 Unspecified lump in the right breast, upper outer quadrant: Secondary | ICD-10-CM

## 2024-03-10 ENCOUNTER — Other Ambulatory Visit: Payer: Self-pay

## 2024-03-10 DIAGNOSIS — R928 Other abnormal and inconclusive findings on diagnostic imaging of breast: Secondary | ICD-10-CM

## 2024-04-10 ENCOUNTER — Encounter: Payer: Self-pay | Admitting: Gastroenterology

## 2024-05-05 ENCOUNTER — Encounter: Payer: Self-pay | Admitting: Gastroenterology

## 2024-05-05 ENCOUNTER — Ambulatory Visit: Admitting: Gastroenterology

## 2024-05-05 VITALS — BP 142/80 | HR 89 | Ht 63.0 in | Wt 146.1 lb

## 2024-05-05 DIAGNOSIS — K5909 Other constipation: Secondary | ICD-10-CM

## 2024-05-05 DIAGNOSIS — K581 Irritable bowel syndrome with constipation: Secondary | ICD-10-CM

## 2024-05-05 DIAGNOSIS — R110 Nausea: Secondary | ICD-10-CM

## 2024-05-05 DIAGNOSIS — R140 Abdominal distension (gaseous): Secondary | ICD-10-CM | POA: Diagnosis not present

## 2024-05-05 DIAGNOSIS — K219 Gastro-esophageal reflux disease without esophagitis: Secondary | ICD-10-CM

## 2024-05-05 MED ORDER — POLYETHYLENE GLYCOL 3350 17 G PO PACK: 17.0000 g | PACK | Freq: Every day | ORAL | Status: AC | PRN

## 2024-05-05 MED ORDER — PANTOPRAZOLE SODIUM 20 MG PO TBEC: 20.0000 mg | DELAYED_RELEASE_TABLET | Freq: Every day | ORAL | 3 refills | Status: AC

## 2024-05-05 NOTE — Progress Notes (Signed)
 Agree with assessment and plan as outlined.

## 2024-05-05 NOTE — Progress Notes (Signed)
 "  Chief Complaint: Multiple GI issues Primary GI MD: Unassigned  HPI: Discussed the use of AI scribe software for clinical note transcription with the patient, who gave verbal consent to proceed.  History of Present Illness   Gail Byrd is a 29 year old female with chronic gastrointestinal symptoms who presents for evaluation of worsening acid reflux.  Gastrointestinal symptoms have fluctuated over time, with recent worsening of acid reflux. Acid reflux has increased in frequency and severity, now triggered by sodas, coffee, and energy drinks. She avoids sodas due to exacerbation of reflux but is reluctant to eliminate coffee. Regurgitation of food is present, which she previously considered normal until her partner noted otherwise. Famotidine  provides partial relief when taken regularly, but she is inconsistent with dosing due to confusion about instructions.  Nausea occurs most often after eating and has increased since switching birth control pills, which she takes inconsistently. Bloating is immediate after meals, with visible abdominal distension and pain.  Constipation has worsened, with a change from daily bowel movements to several days without a movement. Previously, she experienced urgent need for defecation at work.  Feels this may be related to her birth control changes well.  She is not on a regular bowel regimen but found magnesium  citrate effective when used recently. She has not tried Miralax  herself. Fiber intake is low, and eating is difficult when symptomatic. An abdominal x-ray in November 2018 showed significant stool burden, and she had an emergency room visit at that time.  A comprehensive workup by her primary care provider included negative celiac serology, negative H. pylori testing, normal inflammatory markers, and normal thyroid  function.  She uses Excedrin for frequent migraines and neck/back pain, and occasionally takes ibuprofen , which she recognizes may  worsen reflux. Sciatica and chronic neck and back pain are present, with recent intense neck pain upon waking. Ibuprofen  is most effective for pain relief but raises concern for gastrointestinal symptoms.  Health-related anxiety is present, particularly regarding the lack of a formal diagnosis and concern about long-term complications of acid reflux. She has considered endometriosis as a possible cause after researching her symptoms.    Transportation is dependent on her partner, limiting appointment attendance. She has not visited the dentist in over a decade and has significant family responsibilities, including caring for her two daughters age 21 and 31.  Past Medical History:  Diagnosis Date   Allergy    Bacterial vaginosis    Chlamydia    GERD (gastroesophageal reflux disease)    Trichomonas infection    Urinary tract infection    Vaginal yeast infection     Past Surgical History:  Procedure Laterality Date   NO PAST SURGERIES      Current Outpatient Medications  Medication Sig Dispense Refill   acetaminophen  (TYLENOL ) 325 MG tablet Take 2 tablets (650 mg total) every 4 (four) hours as needed by mouth for moderate pain (for pain scale < 4). 60 tablet 1   benzonatate  (TESSALON ) 100 MG capsule Take 1 capsule (100 mg total) by mouth every 8 (eight) hours as needed for cough. 21 capsule 0   famotidine  (PEPCID ) 20 MG tablet Take 1 tablet (20 mg total) by mouth 2 (two) times daily. 60 tablet 3   ibuprofen  (ADVIL ) 600 MG tablet Take 1 tablet (600 mg total) by mouth every 6 (six) hours as needed for cramping. 30 tablet 0   LARIN FE 1/20 1-20 MG-MCG tablet Take 1 tablet by mouth daily.     magnesium  hydroxide (  MILK OF MAGNESIA) 400 MG/5ML suspension Take 60 mLs by mouth daily as needed for mild constipation.     methocarbamol  (ROBAXIN ) 500 MG tablet Take 1 tablet (500 mg total) by mouth every 8 (eight) hours as needed for muscle spasms. 30 tablet 0   pantoprazole  (PROTONIX ) 20 MG tablet Take  1 tablet (20 mg total) by mouth daily. 30 tablet 3   polyethylene glycol (MIRALAX ) 17 g packet Take 17 g by mouth daily as needed.     VENTOLIN  HFA 108 (90 Base) MCG/ACT inhaler Inhale 2 puffs into the lungs every 4 (four) hours as needed for wheezing or shortness of breath.     No current facility-administered medications for this visit.    Allergies as of 05/05/2024   (No Known Allergies)    Family History  Problem Relation Age of Onset   Diabetes Paternal Grandmother    Depression Paternal Grandmother    Drug abuse Paternal Grandmother    Early death Paternal Grandmother    Heart disease Paternal Grandmother    Hypertension Paternal Grandmother    Birth defects Mother    Depression Mother    Learning disabilities Mother    Learning disabilities Father    Learning disabilities Sister    Learning disabilities Brother    Alcohol abuse Maternal Grandmother    Depression Maternal Grandmother    Mental illness Maternal Grandmother    Arthritis Neg Hx    Asthma Neg Hx    Cancer Neg Hx    COPD Neg Hx    Hearing loss Neg Hx    Hyperlipidemia Neg Hx    Kidney disease Neg Hx    Mental retardation Neg Hx    Miscarriages / Stillbirths Neg Hx    Stroke Neg Hx    Vision loss Neg Hx    Varicose Veins Neg Hx     Social History   Socioeconomic History   Marital status: Single    Spouse name: Not on file   Number of children: Not on file   Years of education: Not on file   Highest education level: Not on file  Occupational History   Not on file  Tobacco Use   Smoking status: Never   Smokeless tobacco: Never  Vaping Use   Vaping status: Never Used  Substance and Sexual Activity   Alcohol use: No   Drug use: No   Sexual activity: Yes    Birth control/protection: None  Other Topics Concern   Not on file  Social History Narrative   Not on file   Social Drivers of Health   Tobacco Use: Low Risk (01/09/2024)   Patient History    Smoking Tobacco Use: Never    Smokeless  Tobacco Use: Never    Passive Exposure: Not on file  Financial Resource Strain: Not on file  Food Insecurity: Not on file  Transportation Needs: Not on file  Physical Activity: Not on file  Stress: Not on file  Social Connections: Not on file  Intimate Partner Violence: Not on file  Depression (PHQ2-9): Low Risk (01/09/2024)   Depression (PHQ2-9)    PHQ-2 Score: 4  Recent Concern: Depression (PHQ2-9) - Medium Risk (12/09/2023)   Depression (PHQ2-9)    PHQ-2 Score: 5  Alcohol Screen: Not on file  Housing: Not on file  Utilities: Not on file  Health Literacy: Not on file    Review of Systems:    Constitutional: No weight loss, fever, chills, weakness or fatigue HEENT: Eyes: No change  in vision               Ears, Nose, Throat:  No change in hearing or congestion Skin: No rash or itching Cardiovascular: No chest pain, chest pressure or palpitations   Respiratory: No SOB or cough Gastrointestinal: See HPI and otherwise negative Genitourinary: No dysuria or change in urinary frequency Neurological: No headache, dizziness or syncope Musculoskeletal: No new muscle or joint pain Hematologic: No bleeding or bruising Psychiatric: No history of depression or anxiety    Physical Exam:  Vital signs: BP (!) 142/80   Pulse 89   Ht 5' 3 (1.6 m)   Wt 146 lb 2 oz (66.3 kg)   BMI 25.88 kg/m   Constitutional: NAD, alert and cooperative Head:  Normocephalic and atraumatic. Eyes:   PEERL, EOMI. No icterus. Conjunctiva pink. Respiratory: Respirations even and unlabored. Lungs clear to auscultation bilaterally.   No wheezes, crackles, or rhonchi.  Cardiovascular:  Regular rate and rhythm. No peripheral edema, cyanosis or pallor.  Gastrointestinal:  Soft, nondistended, nontender. No rebound or guarding.  Hypoactive bowel sounds. No appreciable masses or hepatomegaly. Rectal:  Declines Msk:  Symmetrical without gross deformities. Without edema, no deformity or joint abnormality.  Neurologic:   Alert and  oriented x4;  grossly normal neurologically.  Skin:   Dry and intact without significant lesions or rashes. Psychiatric: Oriented to person, place and time. Demonstrates good judgement and reason without abnormal affect or behaviors.  Physical Exam    RELEVANT LABS AND IMAGING: CBC    Component Value Date/Time   WBC 4.1 12/09/2023 1558   RBC 4.60 12/09/2023 1558   HGB 13.7 12/09/2023 1558   HCT 41.3 12/09/2023 1558   PLT 231.0 12/09/2023 1558   MCV 89.8 12/09/2023 1558   MCH 31.0 02/28/2017 1330   MCHC 33.1 12/09/2023 1558   RDW 12.9 12/09/2023 1558   LYMPHSABS 2.1 12/09/2023 1558   MONOABS 0.3 12/09/2023 1558   EOSABS 0.0 12/09/2023 1558   BASOSABS 0.0 12/09/2023 1558    CMP     Component Value Date/Time   NA 139 12/09/2023 1558   K 3.9 12/09/2023 1558   CL 107 12/09/2023 1558   CO2 22 12/09/2023 1558   GLUCOSE 81 12/09/2023 1558   BUN 12 12/09/2023 1558   CREATININE 0.86 12/09/2023 1558   CALCIUM  9.1 12/09/2023 1558   PROT 6.8 12/09/2023 1558   ALBUMIN 4.1 12/09/2023 1558   AST 14 12/09/2023 1558   ALT 18 12/09/2023 1558   ALKPHOS 47 12/09/2023 1558   BILITOT 0.3 12/09/2023 1558   GFRNONAA >60 02/28/2017 1330   GFRAA >60 02/28/2017 1330     Assessment/Plan:   GERD Abdominal bloating Nausea Negative h pylori breath test. Negative celiac serology. Normal CBC/CMP/TSH/inflammatory markers. Daily NSAID use for migraines/neck pain.  Nausea increased when birth control changed.  Inconsistent famotidine  20 mg once daily, though when using it she does have improvement.  Suspect the symptoms could be worsened by her constipation.  Patient would like extensive workup.  Discussed step-by-step approach-year-old-year-old female time, patient would prefer to go ahead and do all the things possible to help ease her health anxiety. - SIBO breath test for bloating - EGD for persistent GERD - Trial of pantoprazole  20 mg once daily - RUQ US  to rule out  gallstones -- avoid NSAIDs - Educated patient on lifestyle modifications and provided patient education handouts  Chronic constipation Suspect her worsening constipation is contributing to the above symptoms as well.  Inconsistency with  medication regimen provides a barrier.  Normal TSH. - MiraLAX  1 capful per day and adjust dose based on response - Increase water, increase fiber, increase exercise  States she will call back to schedule. Assigned to Dr. Leigh (Tuesday)  Nestor Blower, [place]  [facility] 05/05/2024, 3:30 PM  Cc: Alvia Corean CROME, FNP "

## 2024-05-05 NOTE — Patient Instructions (Signed)
 Start taking Miralax  1 capful (17 grams) 1x / day for 1 week.   If this is not effective, increase to 1 dose 2x / day for 1 week.   If this is still not effective, increase to two capfuls (34 grams) 2x / day.   Can adjust dose as needed based on response. Can take 1/2 cap daily, skip days, or increase per day.    It has been recommended to you by your physician that you have a(n) EGD completed. Per your request, we did not schedule the procedure(s) today. Please contact our office at [phone] should you decide to have the procedure completed. You will be scheduled for a pre-visit and procedure at that time.   You have been scheduled for an abdominal ultrasound at [facility]iology (1st floor of hospital) on Tuesday, 05-12-24 at 7:00am. Please arrive 15 minutes prior to your appointment for registration. Make certain not to have anything to eat or drink 6 hours prior to your appointment. Should you need to reschedule your appointment, please contact radiology at [phone]. This test typically takes about 30 minutes to perform.  You have been given a testing kit to check for small intestine bacterial overgrowth (SIBO) which is completed by a company named [employer]. Make sure to return your test in the mail using the return mailing label given to you along with the kit. The test order, your demographic and insurance information have all already been sent to the company. [facility] will collect an upfront charge of $109.00 for commercial insurance plans and $229.00 if you are paying cash. The potential remaining total after claim submission and review is $120.00. Make sure to discuss with [facility] PRIOR to having the test to see if they have gotten information from your insurance company as to how much your testing will cost out of pocket, if any. Please contact [facility] at phone number [phone] to get instructions regarding how to perform the test as our office is  unable to give specific testing instructions.    We have sent the following medications to your pharmacy for you to pick up at your convenience: Pantoprazole  20 mg: Take once daily  Please purchase the following medications over the counter and take as directed: Miralax  - Take as directed above   Thank you for trusting me with your gastrointestinal care!   Nestor Blower, PA

## 2024-05-12 ENCOUNTER — Ambulatory Visit (HOSPITAL_COMMUNITY)

## 2024-09-09 ENCOUNTER — Other Ambulatory Visit
